# Patient Record
Sex: Female | Born: 1935 | Race: White | Hispanic: No | Marital: Married | Smoking: Never smoker
Health system: Southern US, Community
[De-identification: ages and names within clinical notes are randomized; demographics above are authoritative.]

## PROBLEM LIST (undated history)

## (undated) DIAGNOSIS — R002 Palpitations: Secondary | ICD-10-CM

## (undated) DIAGNOSIS — R21 Rash and other nonspecific skin eruption: Secondary | ICD-10-CM

## (undated) DIAGNOSIS — R194 Change in bowel habit: Secondary | ICD-10-CM

## (undated) DIAGNOSIS — D649 Anemia, unspecified: Secondary | ICD-10-CM

## (undated) DIAGNOSIS — I10 Essential (primary) hypertension: Secondary | ICD-10-CM

## (undated) DIAGNOSIS — M1990 Unspecified osteoarthritis, unspecified site: Secondary | ICD-10-CM

## (undated) DIAGNOSIS — K219 Gastro-esophageal reflux disease without esophagitis: Secondary | ICD-10-CM

## (undated) DIAGNOSIS — T4145XA Adverse effect of unspecified anesthetic, initial encounter: Secondary | ICD-10-CM

## (undated) HISTORY — PX: TONSILLECTOMY: SUR1361

## (undated) HISTORY — PX: APPENDECTOMY: SHX54

## (undated) HISTORY — PX: GYNECOLOGIC CRYOSURGERY: SHX857

---

## 1999-05-27 ENCOUNTER — Ambulatory Visit (HOSPITAL_COMMUNITY): Admission: RE | Admit: 1999-05-27 | Discharge: 1999-05-27 | Payer: Self-pay | Admitting: Gastroenterology

## 1999-12-28 ENCOUNTER — Other Ambulatory Visit: Admission: RE | Admit: 1999-12-28 | Discharge: 1999-12-28 | Payer: Self-pay | Admitting: *Deleted

## 2000-02-01 ENCOUNTER — Emergency Department (HOSPITAL_COMMUNITY): Admission: EM | Admit: 2000-02-01 | Discharge: 2000-02-02 | Payer: Self-pay | Admitting: Emergency Medicine

## 2000-12-21 ENCOUNTER — Other Ambulatory Visit: Admission: RE | Admit: 2000-12-21 | Discharge: 2000-12-21 | Payer: Self-pay | Admitting: *Deleted

## 2003-01-01 ENCOUNTER — Other Ambulatory Visit: Admission: RE | Admit: 2003-01-01 | Discharge: 2003-01-01 | Payer: Self-pay | Admitting: *Deleted

## 2004-01-05 ENCOUNTER — Other Ambulatory Visit: Admission: RE | Admit: 2004-01-05 | Discharge: 2004-01-05 | Payer: Self-pay | Admitting: *Deleted

## 2004-08-24 ENCOUNTER — Ambulatory Visit (HOSPITAL_COMMUNITY): Admission: RE | Admit: 2004-08-24 | Discharge: 2004-08-24 | Payer: Self-pay | Admitting: Gastroenterology

## 2006-02-23 ENCOUNTER — Other Ambulatory Visit: Admission: RE | Admit: 2006-02-23 | Discharge: 2006-02-23 | Payer: Self-pay | Admitting: *Deleted

## 2007-03-19 ENCOUNTER — Other Ambulatory Visit: Admission: RE | Admit: 2007-03-19 | Discharge: 2007-03-19 | Payer: Self-pay | Admitting: *Deleted

## 2007-05-15 ENCOUNTER — Encounter: Admission: RE | Admit: 2007-05-15 | Discharge: 2007-06-07 | Payer: Self-pay | Admitting: *Deleted

## 2007-06-28 ENCOUNTER — Emergency Department (HOSPITAL_COMMUNITY): Admission: EM | Admit: 2007-06-28 | Discharge: 2007-06-29 | Payer: Self-pay | Admitting: *Deleted

## 2009-08-06 ENCOUNTER — Ambulatory Visit (HOSPITAL_COMMUNITY): Admission: RE | Admit: 2009-08-06 | Discharge: 2009-08-06 | Payer: Self-pay | Admitting: Gastroenterology

## 2011-02-11 NOTE — Op Note (Signed)
NAMELEIGHLA, CHESTNUTT              ACCOUNT NO.:  [id number]   MEDICAL RECORD NO.:  [id number]          PATIENT TYPE:  AMB   LOCATION:  ENDO                         FACILITY:  [facility]   PHYSICIAN:  Danise Edge, M.D.   DATE OF BIRTH:  [birth year]   DATE OF PROCEDURE:  08/24/2004  DATE OF DISCHARGE:                                 OPERATIVE REPORT   INDICATIONS:  Ms. Kaniyah Lisby is a 77 year old female born 02/20/1932.  Approximately 10 years ago, Ms. Mace underwent a colonoscopy and a  neoplastic polyp was removed. Her colonoscopy performed by me 5 years ago  was normal. Ms. Christenbury is scheduled to undergo a surveillance colonoscopy  with polypectomy to prevent colon cancer.   ENDOSCOPIST:  Danise Edge, M.D.   PREMEDICATION:  Versed 6 mg, Demerol 50 mg.   PROCEDURE NOTE:  After obtaining informed consent, Ms. Lagasse was placed  in the left lateral decubitus position. I administered intravenous Demerol  and intravenous Versed to achieve conscious sedation for the procedure. The  patient's blood pressure, oxygen saturation, and cardiac rhythm were  monitored throughout the procedure and documented in the medical record.   Anal inspection and digital rectal exam were normal. The Olympus adjustable  pediatric colonoscopy was introduced into the rectum and advanced to the  cecum. Colonic preparation for exam today was excellent.   Rectum:  Normal.   Sigmoid colon and descending colon:  Normal.   Splenic flexure:  Normal.   Transverse colon:  Normal.   Hepatic flexure:  Normal.   Ascending colon:  Normal.   Cecum and ileocecal valve:  Normal.   ASSESSMENT:  Normal proctocolonoscopy to the cecum. No endoscopic evidence  for the presence of recurrent colorectal neoplasia.   RECOMMENDATIONS:  Repeat colon polyp screen in 5 years.      MJ/MEDQ  D:  08/24/2004  T:  08/24/2004  Job:  [id number]   cc:   Al Decant. Janey Greaser, MD  [address]  [place]  [place] [address]  Fax: [phone]

## 2011-05-26 ENCOUNTER — Ambulatory Visit (HOSPITAL_COMMUNITY)
Admission: RE | Admit: 2011-05-26 | Discharge: 2011-05-26 | Disposition: A | Discharge status: Home / Self Care | Payer: Medicare Other | Source: Ambulatory Visit | Attending: Gastroenterology | Admitting: Gastroenterology

## 2011-05-26 DIAGNOSIS — K5730 Diverticulosis of large intestine without perforation or abscess without bleeding: Secondary | ICD-10-CM | POA: Insufficient documentation

## 2011-05-26 DIAGNOSIS — R197 Diarrhea, unspecified: Secondary | ICD-10-CM | POA: Insufficient documentation

## 2011-05-26 DIAGNOSIS — K648 Other hemorrhoids: Secondary | ICD-10-CM | POA: Insufficient documentation

## 2011-06-05 NOTE — Op Note (Signed)
  NAMECHYNNA, [name] NO.:  [id number]  MEDICAL RECORD NO.:  [id number]  LOCATION:  WLEN                         FACILITY:  [facility]  PHYSICIAN:  Danise Edge, M.D.   DATE OF BIRTH:  07-May-1932  DATE OF PROCEDURE:  05/26/2011 DATE OF DISCHARGE:                              OPERATIVE REPORT   REFERRING PHYSICIAN:  Hessie Diener (Valla Leaver) Tenny Craw, M.D., the family practitioner.  HISTORY:  Ms. Lori [name] is a 77 year old female, born on [birth year].  The patient underwent a normal colonoscopy in November 2010, except for the presence of left colonic diverticulosis.  In July 2012, patient had explosive nonbloody diarrhea while vacationing at [place].  She has continued to have intermittent loose bowel movements without gastrointestinal bleeding, excessive flatus, excessive mucus with bowel movements, and rectal pressure.  ENDOSCOPIST:  Danise Edge, M.D.  PREMEDICATION: 1. Fentanyl 50 mcg. 2. Versed 5 mg.  PROCEDURE:  Anal inspection and digital rectal exam were normal.  The Pentax pediatric colonoscope was introduced into the rectum and with some degree of difficulty due to sigmoid colonic loop formation, advanced to approximately 35 cm from the anal verge.  Endoscopic appearance of the rectal mucosa was normal.  Retroflexed view of the distal rectum showed moderate size nonbleeding internal hemorrhoids.  Examination of the sigmoid colon revealed extensive colonic diverticulosis without diverticulitis.  ASSESSMENT: 1. Sigmoid colonic diverticulosis. 2. Moderate sized internal hemorrhoids. 3. Otherwise, normal flexible proctosigmoidoscopy.          ______________________________ Danise Edge, M.D.     MJ/MEDQ  D:  05/26/2011  T:  05/27/2011  Job:  [id number]  cc:   Magnus Sinning) Tenny Craw, M.D. Fax: [phone]  Electronically Signed by Danise Edge M.D. on 06/05/2011 09:13:23 AM

## 2011-07-07 LAB — CBC
HCT: 38.8
Hemoglobin: 13
MCHC: 33.5
MCV: 94
Platelets: 302
RBC: 4.12
RDW: 13
WBC: 9.3

## 2011-07-07 LAB — COMPREHENSIVE METABOLIC PANEL
CO2: 28
Calcium: 9.4
Creatinine, Ser: 0.64
GFR calc Af Amer: >60
GFR calc non Af Amer: >60
Glucose, Bld: 96
Total Protein: 6.8

## 2011-07-07 LAB — URINE MICROSCOPIC-ADD ON

## 2011-07-07 LAB — URINALYSIS, ROUTINE W REFLEX MICROSCOPIC
Bilirubin Urine: NEGATIVE
Glucose, UA: NEGATIVE
Hgb urine dipstick: NEGATIVE
Ketones, ur: NEGATIVE
Nitrite: NEGATIVE
Protein, ur: NEGATIVE
Specific Gravity, Urine: 1.011
Urobilinogen, UA: 0.2
pH: 6.5

## 2011-07-07 LAB — DIFFERENTIAL
Basophils Absolute: 0
Basophils Relative: 0
Eosinophils Absolute: 0.1
Eosinophils Relative: 1
Lymphocytes Relative: 33
Lymphs Abs: 3.1
Monocytes Absolute: 0.8 — ABNORMAL HIGH
Monocytes Relative: 9
Neutro Abs: 5.3
Neutrophils Relative %: 57

## 2011-07-07 LAB — LIPASE, BLOOD: Lipase: 22

## 2011-12-27 DIAGNOSIS — R198 Other specified symptoms and signs involving the digestive system and abdomen: Secondary | ICD-10-CM | POA: Diagnosis not present

## 2012-01-02 DIAGNOSIS — Z1231 Encounter for screening mammogram for malignant neoplasm of breast: Secondary | ICD-10-CM | POA: Diagnosis not present

## 2012-01-04 DIAGNOSIS — R928 Other abnormal and inconclusive findings on diagnostic imaging of breast: Secondary | ICD-10-CM | POA: Diagnosis not present

## 2012-01-17 DIAGNOSIS — R198 Other specified symptoms and signs involving the digestive system and abdomen: Secondary | ICD-10-CM | POA: Diagnosis not present

## 2012-01-17 DIAGNOSIS — R141 Gas pain: Secondary | ICD-10-CM | POA: Diagnosis not present

## 2012-01-17 DIAGNOSIS — R634 Abnormal weight loss: Secondary | ICD-10-CM | POA: Diagnosis not present

## 2012-01-17 DIAGNOSIS — R143 Flatulence: Secondary | ICD-10-CM | POA: Diagnosis not present

## 2012-01-17 DIAGNOSIS — R197 Diarrhea, unspecified: Secondary | ICD-10-CM | POA: Diagnosis not present

## 2012-01-18 DIAGNOSIS — R197 Diarrhea, unspecified: Secondary | ICD-10-CM | POA: Diagnosis not present

## 2012-02-29 DIAGNOSIS — R197 Diarrhea, unspecified: Secondary | ICD-10-CM | POA: Diagnosis not present

## 2012-02-29 DIAGNOSIS — R143 Flatulence: Secondary | ICD-10-CM | POA: Diagnosis not present

## 2012-02-29 DIAGNOSIS — R142 Eructation: Secondary | ICD-10-CM | POA: Diagnosis not present

## 2012-04-03 DIAGNOSIS — D649 Anemia, unspecified: Secondary | ICD-10-CM | POA: Diagnosis not present

## 2012-04-03 DIAGNOSIS — E7800 Pure hypercholesterolemia, unspecified: Secondary | ICD-10-CM | POA: Diagnosis not present

## 2012-04-03 DIAGNOSIS — I10 Essential (primary) hypertension: Secondary | ICD-10-CM | POA: Diagnosis not present

## 2012-05-16 DIAGNOSIS — R141 Gas pain: Secondary | ICD-10-CM | POA: Diagnosis not present

## 2012-05-16 DIAGNOSIS — R197 Diarrhea, unspecified: Secondary | ICD-10-CM | POA: Diagnosis not present

## 2012-05-16 DIAGNOSIS — R142 Eructation: Secondary | ICD-10-CM | POA: Diagnosis not present

## 2012-07-30 DIAGNOSIS — Z23 Encounter for immunization: Secondary | ICD-10-CM | POA: Diagnosis not present

## 2012-08-08 DIAGNOSIS — R198 Other specified symptoms and signs involving the digestive system and abdomen: Secondary | ICD-10-CM | POA: Diagnosis not present

## 2012-08-08 DIAGNOSIS — R141 Gas pain: Secondary | ICD-10-CM | POA: Diagnosis not present

## 2012-08-08 DIAGNOSIS — R143 Flatulence: Secondary | ICD-10-CM | POA: Diagnosis not present

## 2012-09-14 DIAGNOSIS — M169 Osteoarthritis of hip, unspecified: Secondary | ICD-10-CM | POA: Diagnosis not present

## 2012-09-26 HISTORY — PX: JOINT REPLACEMENT: SHX530

## 2012-10-26 DIAGNOSIS — M169 Osteoarthritis of hip, unspecified: Secondary | ICD-10-CM | POA: Diagnosis not present

## 2012-10-31 DIAGNOSIS — M169 Osteoarthritis of hip, unspecified: Secondary | ICD-10-CM | POA: Diagnosis not present

## 2012-11-02 DIAGNOSIS — M169 Osteoarthritis of hip, unspecified: Secondary | ICD-10-CM | POA: Diagnosis not present

## 2012-11-05 DIAGNOSIS — M169 Osteoarthritis of hip, unspecified: Secondary | ICD-10-CM | POA: Diagnosis not present

## 2012-11-07 DIAGNOSIS — M169 Osteoarthritis of hip, unspecified: Secondary | ICD-10-CM | POA: Diagnosis not present

## 2012-11-12 DIAGNOSIS — M169 Osteoarthritis of hip, unspecified: Secondary | ICD-10-CM | POA: Diagnosis not present

## 2012-11-16 DIAGNOSIS — M169 Osteoarthritis of hip, unspecified: Secondary | ICD-10-CM | POA: Diagnosis not present

## 2012-11-19 DIAGNOSIS — M169 Osteoarthritis of hip, unspecified: Secondary | ICD-10-CM | POA: Diagnosis not present

## 2012-11-21 DIAGNOSIS — M169 Osteoarthritis of hip, unspecified: Secondary | ICD-10-CM | POA: Diagnosis not present

## 2013-02-11 DIAGNOSIS — R143 Flatulence: Secondary | ICD-10-CM | POA: Diagnosis not present

## 2013-02-26 DIAGNOSIS — M169 Osteoarthritis of hip, unspecified: Secondary | ICD-10-CM | POA: Diagnosis not present

## 2013-04-03 DIAGNOSIS — D649 Anemia, unspecified: Secondary | ICD-10-CM | POA: Diagnosis not present

## 2013-04-03 DIAGNOSIS — R002 Palpitations: Secondary | ICD-10-CM | POA: Diagnosis not present

## 2013-04-03 DIAGNOSIS — E7800 Pure hypercholesterolemia, unspecified: Secondary | ICD-10-CM | POA: Diagnosis not present

## 2013-04-03 DIAGNOSIS — I10 Essential (primary) hypertension: Secondary | ICD-10-CM | POA: Diagnosis not present

## 2013-06-18 DIAGNOSIS — R21 Rash and other nonspecific skin eruption: Secondary | ICD-10-CM | POA: Diagnosis not present

## 2013-06-18 DIAGNOSIS — M169 Osteoarthritis of hip, unspecified: Secondary | ICD-10-CM | POA: Diagnosis not present

## 2013-06-18 DIAGNOSIS — Z23 Encounter for immunization: Secondary | ICD-10-CM | POA: Diagnosis not present

## 2013-06-18 DIAGNOSIS — Z1231 Encounter for screening mammogram for malignant neoplasm of breast: Secondary | ICD-10-CM | POA: Diagnosis not present

## 2013-06-28 NOTE — Progress Notes (Signed)
Need orders in EPIC.  Surgery scheduled for 07/17/13.  Preop on 07/10/13 at 0930am  Thank YOu.

## 2013-07-04 ENCOUNTER — Other Ambulatory Visit: Payer: Self-pay | Admitting: Orthopedic Surgery

## 2013-07-04 NOTE — Progress Notes (Signed)
Need orders please - pt coming for preop WED 07/10/13 - thank you

## 2013-07-08 ENCOUNTER — Encounter (HOSPITAL_COMMUNITY): Payer: Self-pay | Admitting: Pharmacy Technician

## 2013-07-10 ENCOUNTER — Encounter (HOSPITAL_COMMUNITY): Payer: Self-pay

## 2013-07-10 ENCOUNTER — Ambulatory Visit (HOSPITAL_COMMUNITY)
Admission: RE | Admit: 2013-07-10 | Discharge: 2013-07-10 | Disposition: A | Discharge status: Home / Self Care | Payer: Medicare Other | Source: Ambulatory Visit | Attending: Orthopedic Surgery | Admitting: Orthopedic Surgery

## 2013-07-10 ENCOUNTER — Encounter (HOSPITAL_COMMUNITY)
Admission: RE | Admit: 2013-07-10 | Discharge: 2013-07-10 | Disposition: A | Discharge status: Home / Self Care | Payer: Medicare Other | Source: Ambulatory Visit | Attending: Orthopedic Surgery | Admitting: Orthopedic Surgery

## 2013-07-10 DIAGNOSIS — Z01812 Encounter for preprocedural laboratory examination: Secondary | ICD-10-CM | POA: Diagnosis not present

## 2013-07-10 DIAGNOSIS — I10 Essential (primary) hypertension: Secondary | ICD-10-CM | POA: Insufficient documentation

## 2013-07-10 DIAGNOSIS — M169 Osteoarthritis of hip, unspecified: Secondary | ICD-10-CM | POA: Insufficient documentation

## 2013-07-10 DIAGNOSIS — R52 Pain, unspecified: Secondary | ICD-10-CM

## 2013-07-10 DIAGNOSIS — Z01818 Encounter for other preprocedural examination: Secondary | ICD-10-CM | POA: Diagnosis not present

## 2013-07-10 DIAGNOSIS — M1610 Unilateral primary osteoarthritis, unspecified hip: Secondary | ICD-10-CM | POA: Insufficient documentation

## 2013-07-10 HISTORY — DX: Rash and other nonspecific skin eruption: R21

## 2013-07-10 HISTORY — DX: Unspecified osteoarthritis, unspecified site: M19.90

## 2013-07-10 HISTORY — DX: Essential (primary) hypertension: I10

## 2013-07-10 HISTORY — DX: Change in bowel habit: R19.4

## 2013-07-10 HISTORY — DX: Palpitations: R00.2

## 2013-07-10 HISTORY — DX: Anemia, unspecified: D64.9

## 2013-07-10 HISTORY — DX: Gastro-esophageal reflux disease without esophagitis: K21.9

## 2013-07-10 HISTORY — DX: Adverse effect of unspecified anesthetic, initial encounter: T41.45XA

## 2013-07-10 LAB — COMPREHENSIVE METABOLIC PANEL
ALT: 13 U/L (ref 0–35)
Albumin: 4.1 g/dL (ref 3.5–5.2)
Alkaline Phosphatase: 50 U/L (ref 39–117)
BUN: 13 mg/dL (ref 6–23)
CO2: 29 mEq/L (ref 19–32)
Chloride: 103 mEq/L (ref 96–112)
Creatinine, Ser: 0.63 mg/dL (ref 0.50–1.10)
GFR calc non Af Amer: 83 mL/min — ABNORMAL LOW (ref >90)
Potassium: 5.8 mEq/L — ABNORMAL HIGH (ref 3.5–5.1)
Total Bilirubin: 0.3 mg/dL (ref 0.3–1.2)

## 2013-07-10 LAB — URINALYSIS, ROUTINE W REFLEX MICROSCOPIC
Bilirubin Urine: NEGATIVE
Glucose, UA: NEGATIVE mg/dL
Hgb urine dipstick: NEGATIVE
Ketones, ur: NEGATIVE mg/dL
Specific Gravity, Urine: 1.013 (ref 1.005–1.030)
Urobilinogen, UA: 0.2 mg/dL (ref 0.0–1.0)
pH: 7 (ref 5.0–8.0)

## 2013-07-10 LAB — SURGICAL PCR SCREEN
MRSA, PCR: NEGATIVE
Staphylococcus aureus: NEGATIVE

## 2013-07-10 LAB — CBC
Hemoglobin: 13.1 g/dL (ref 12.0–15.0)
MCHC: 33.3 g/dL (ref 30.0–36.0)
RBC: 4.22 MIL/uL (ref 3.87–5.11)
WBC: 5.3 10*3/uL (ref 4.0–10.5)

## 2013-07-10 LAB — URINE MICROSCOPIC-ADD ON

## 2013-07-10 LAB — APTT: aPTT: 31 seconds (ref 24–37)

## 2013-07-10 LAB — ABO/RH: ABO/RH(D): A POS

## 2013-07-10 LAB — PROTIME-INR
INR: 0.94 (ref 0.00–1.49)
Prothrombin Time: 12.4 seconds (ref 11.6–15.2)

## 2013-07-10 NOTE — Patient Instructions (Signed)
TIERNAN MILLIKIN  07/10/2013                           YOUR PROCEDURE IS SCHEDULED ON:  07/17/13               PLEASE REPORT TO SHORT STAY CENTER AT :  9:15 AM               CALL THIS NUMBER IF ANY PROBLEMS THE DAY OF SURGERY :               [phone]                      REMEMBER:   Do not eat food or drink liquids AFTER MIDNIGHT   Take these medicines the morning of surgery with A SIP OF WATER:  METOPROLOL   Do not wear jewelry, make-up   Do not wear lotions, powders, or perfumes.   Do not shave legs or underarms 12 hrs. before surgery (men may shave face)  Do not bring valuables to the hospital.  Contacts, dentures or bridgework may not be worn into surgery.  Leave suitcase in the car. After surgery it may be brought to your room.  For patients admitted to the hospital more than one night, checkout time is 11:00                          The day of discharge.   Patients discharged the day of surgery will not be allowed to drive home                             If going home same day of surgery, must have someone stay with you first                           24 hrs at home and arrange for some one to drive you home from hospital.    Special Instructions:   Please read over the following fact sheets that you were given:               1. MRSA  INFORMATION                      2. [facility] PREPARING FOR SURGERY SHEET               3. INCENTIVE SPIROMETER                                                X_____________________________________________________________________        Failure to follow these instructions may result in cancellation of your surgery

## 2013-07-10 NOTE — Progress Notes (Signed)
Abnormal CMET routed to Dr. Aluisio 

## 2013-07-14 ENCOUNTER — Other Ambulatory Visit: Payer: Self-pay | Admitting: Orthopedic Surgery

## 2013-07-14 NOTE — H&P (Signed)
Lori [name]. Lori [name]  DOB: Sep 21, 1932 Married / Language: English / Race: White Female  Date of Admission:  07/17/2013  Chief Complaint:  Right Hip Pain  History of Present Illness The patient is a 79 year old female who comes in for a preoperative History and Physical. The patient is scheduled for a right total hip arthroplasty (anterior approach) to be performed by Dr. Gus Rankin. Aluisio, MD at [facility] on 07/17/2013. The patient is a 79 year old female who presents for follow up of their hip. The patient is being followed for their right hip pain, osteoarthritis and hamstring pain.  She wanted to discuss the anterior approach total hip and find out what her options are. She decided to not do the intra articular injection with Dr. Ethelene Hal. Unfortunately her hip is getting a lot worse. She has pain with most activities. Generally she does fairly well at rest and with only occasional rest pain. She generally sleeps fairly well at night. The pain is mainly in the groin going down the anterior thigh. She is not having back pain. She is not having lower extremity weakness or paresthesia. She is ready to get the hip fixed. They have been treated conservatively in the past for the above stated problem and despite conservative measures, they continue to have progressive pain and severe functional limitations and dysfunction. They have failed non-operative management including home exercise, medications. It is felt that they would benefit from undergoing total joint replacement. Risks and benefits of the procedure have been discussed with the patient and they elect to proceed with surgery. There are no active contraindications to surgery such as ongoing infection or rapidly progressive neurological disease.   Problem List Osteoarthritis, Hip (715.35)   Allergies Codeine Phosphate *ANALGESICS - OPIOID*    Family History Diabetes Mellitus. father and sister Heart Disease.  mother, father, sister, grandfather mothers side and grandfather fathers side Rheumatoid Arthritis. grandfather fathers side Heart disease in female family member before age 57 Heart disease in female family member before age 28 Osteoarthritis. grandmother mothers side and grandfather fathers side    Social History Exercise. Exercises daily; does running / walking and other Illicit drug use. no Living situation. live with spouse Drug/Alcohol Rehab (Previously). no Children. 4 Current work status. retired Financial planner (Currently). no Tobacco use. never smoker Tobacco / smoke exposure. no Marital status. married Number of flights of stairs before winded. 4-5 Pain Contract. no Alcohol use. current drinker; drinks wine; 5-7 per week Post-Surgical Plans. home Advance Directives. Lving Will, Healthcare POA    Medication History Tylenol Extra Strength (500MG  Tablet, Oral) Active. Metoprolol Tartrate (25MG  Tablet, Oral) Active. Simvastatin (40MG  Tablet, Oral) Active. Benazepril HCl (10MG  Tablet, Oral) Active.   Past Surgical History Tubal Ligation Tonsillectomy Appendectomy Colon Polyp Removal - Colonoscopy  Medical History High blood pressure Hypercholesterolemia Irritable bowel syndrome Impaired Vision Impaired Hearing Varicose veins Hemorrhoids Measles Mumps    Review of Systems General:Not Present- Chills, Fever, Night Sweats, Fatigue, Weight Gain, Weight Loss and Memory Loss. Skin:Not Present- Hives, Itching, Rash, Eczema and Lesions. HEENT:Not Present- Tinnitus, Headache, Double Vision, Visual Loss, Hearing Loss and Dentures. Respiratory:Not Present- Shortness of breath with exertion, Shortness of breath at rest, Allergies, Coughing up blood and Chronic Cough. Cardiovascular:Not Present- Chest Pain, Racing/skipping heartbeats, Difficulty Breathing Lying Down, Murmur, Swelling and Palpitations. Gastrointestinal:Not Present-  Bloody Stool, Heartburn, Abdominal Pain, Vomiting, Nausea, Constipation, Diarrhea, Difficulty Swallowing, Jaundice and Loss of appetitie. Female Genitourinary:Present- Urinary frequency. Not Present- Blood in Urine, Weak  urinary stream, Discharge, Flank Pain, Incontinence, Painful Urination, Urgency, Urinary Retention and Urinating at Night. Musculoskeletal:Present- Muscle Pain, Joint Pain and Morning Stiffness. Not Present- Muscle Weakness, Joint Swelling, Back Pain and Spasms. Neurological:Not Present- Tremor, Dizziness, Blackout spells, Paralysis, Difficulty with balance and Weakness. Psychiatric:Not Present- Insomnia.    Vitals Pulse: 84 (Regular) Resp.: 14 (Unlabored) BP: 118/58 (Sitting, Right Arm, Standard)     Physical Exam The physical exam findings are as follows:   General Mental Status - Alert, cooperative and good historian. General Appearance- pleasant. Not in acute distress. Orientation- Oriented X3. Build & Nutrition- Well nourished and Well developed.   Head and Neck Head- normocephalic, atraumatic . Neck Global Assessment- supple. no bruit auscultated on the right and no bruit auscultated on the left.   Eye Vision- Wears corrective lenses. Pupil- Bilateral- Round. Motion- Bilateral- EOMI.   ENMT  bilateral hearing aids  Chest and Lung Exam Auscultation: Breath sounds:- clear at anterior chest wall and - clear at posterior chest wall. Adventitious sounds:- No Adventitious sounds.   Cardiovascular Auscultation:Rhythm- Regular rate and rhythm. Heart Sounds- S1 WNL and S2 WNL. Murmurs & Other Heart Sounds:Auscultation of the heart reveals - No Murmurs.   Abdomen Palpation/Percussion:Tenderness- Abdomen is non-tender to palpation. Rigidity (guarding)- Abdomen is soft. Auscultation:Auscultation of the abdomen reveals - Bowel sounds normal.   Female Genitourinary Not done, not pertinent to present  illness  Musculoskeletal On exam she is alert and oriented in no apparent distress. Her right hip can be flexed to about 90 with no internal rotation and about 20 external rotation, 20 abduction. Left hip range of motion is normal.  RADIOGRAPHS: Her most recent radiographs, AP pelvis and lateral of the right hip from December, and at that point she had significant bone on bone arthritis with large osteophyte formation.   Assessment & Plan Osteoarthritis, Hip (715.35) Impression: Right Hip  Note: Plan is for a Right Total Hip Replacement - Anterior Approach by Dr. Lequita Halt.  Plan is to go home with family.  PCP - Dr. Vianne Bulls - Patient has been seen preoperatively and felt to be stable for surgery.  The patient does not have any contraindications and will receive TXA (tranexamic acid) prior to surgery.  Avel Peace, PA-C

## 2013-07-16 DIAGNOSIS — H903 Sensorineural hearing loss, bilateral: Secondary | ICD-10-CM | POA: Diagnosis not present

## 2013-07-17 ENCOUNTER — Inpatient Hospital Stay (HOSPITAL_COMMUNITY): Payer: Medicare Other

## 2013-07-17 ENCOUNTER — Encounter (HOSPITAL_COMMUNITY): Payer: Medicare Other | Admitting: Anesthesiology

## 2013-07-17 ENCOUNTER — Inpatient Hospital Stay (HOSPITAL_COMMUNITY)
Admission: RE | Admit: 2013-07-17 | Discharge: 2013-07-19 | DRG: 470 | Disposition: A | Discharge status: Home Health | Payer: Medicare Other | Source: Ambulatory Visit | Attending: Orthopedic Surgery | Admitting: Orthopedic Surgery

## 2013-07-17 ENCOUNTER — Encounter (HOSPITAL_COMMUNITY): Payer: Self-pay | Admitting: *Deleted

## 2013-07-17 ENCOUNTER — Encounter (HOSPITAL_COMMUNITY)
Admission: RE | Disposition: A | Discharge status: Home Health | Payer: Self-pay | Source: Ambulatory Visit | Attending: Orthopedic Surgery

## 2013-07-17 ENCOUNTER — Inpatient Hospital Stay (HOSPITAL_COMMUNITY): Payer: Medicare Other | Admitting: Anesthesiology

## 2013-07-17 DIAGNOSIS — Z79899 Other long term (current) drug therapy: Secondary | ICD-10-CM

## 2013-07-17 DIAGNOSIS — I10 Essential (primary) hypertension: Secondary | ICD-10-CM | POA: Diagnosis present

## 2013-07-17 DIAGNOSIS — E7800 Pure hypercholesterolemia, unspecified: Secondary | ICD-10-CM | POA: Diagnosis present

## 2013-07-17 DIAGNOSIS — H547 Unspecified visual loss: Secondary | ICD-10-CM | POA: Diagnosis present

## 2013-07-17 DIAGNOSIS — M1610 Unilateral primary osteoarthritis, unspecified hip: Secondary | ICD-10-CM | POA: Diagnosis not present

## 2013-07-17 DIAGNOSIS — H9190 Unspecified hearing loss, unspecified ear: Secondary | ICD-10-CM | POA: Diagnosis present

## 2013-07-17 DIAGNOSIS — K219 Gastro-esophageal reflux disease without esophagitis: Secondary | ICD-10-CM | POA: Diagnosis present

## 2013-07-17 DIAGNOSIS — E871 Hypo-osmolality and hyponatremia: Secondary | ICD-10-CM | POA: Diagnosis not present

## 2013-07-17 DIAGNOSIS — D62 Acute posthemorrhagic anemia: Secondary | ICD-10-CM | POA: Diagnosis not present

## 2013-07-17 DIAGNOSIS — M169 Osteoarthritis of hip, unspecified: Secondary | ICD-10-CM | POA: Diagnosis present

## 2013-07-17 DIAGNOSIS — Z96649 Presence of unspecified artificial hip joint: Secondary | ICD-10-CM | POA: Diagnosis not present

## 2013-07-17 DIAGNOSIS — Z471 Aftercare following joint replacement surgery: Secondary | ICD-10-CM | POA: Diagnosis not present

## 2013-07-17 HISTORY — PX: TOTAL HIP ARTHROPLASTY: SHX124

## 2013-07-17 LAB — TYPE AND SCREEN: ABO/RH(D): A POS

## 2013-07-17 SURGERY — ARTHROPLASTY, HIP, TOTAL, ANTERIOR APPROACH
Anesthesia: General | Site: Hip | Laterality: Right | Wound class: Clean

## 2013-07-17 MED ORDER — CEFAZOLIN SODIUM-DEXTROSE 2-3 GM-% IV SOLR
2.0000 g | INTRAVENOUS | Status: AC
  Administered 2013-07-17: 2 g via INTRAVENOUS

## 2013-07-17 MED ORDER — CEFAZOLIN SODIUM 1-5 GM-% IV SOLN
1.0000 g | Freq: Four times a day (QID) | INTRAVENOUS | Status: AC
  Administered 2013-07-17 – 2013-07-18 (×2): 1 g via INTRAVENOUS
  Filled 2013-07-17 (×2): qty 50

## 2013-07-17 MED ORDER — DOCUSATE SODIUM 100 MG PO CAPS
100.0000 mg | ORAL_CAPSULE | Freq: Two times a day (BID) | ORAL | Status: DC
  Administered 2013-07-17 – 2013-07-18 (×3): 100 mg via ORAL

## 2013-07-17 MED ORDER — LACTATED RINGERS IV SOLN
INTRAVENOUS | Status: DC | PRN
  Administered 2013-07-17 (×3): via INTRAVENOUS

## 2013-07-17 MED ORDER — BISACODYL 10 MG RE SUPP: 10.0000 mg | Freq: Every day | RECTAL | Status: DC | PRN

## 2013-07-17 MED ORDER — GLYCOPYRROLATE 0.2 MG/ML IJ SOLN
INTRAMUSCULAR | Status: DC | PRN
  Administered 2013-07-17: 0.6 mg via INTRAVENOUS

## 2013-07-17 MED ORDER — MIDAZOLAM HCL 5 MG/5ML IJ SOLN
INTRAMUSCULAR | Status: DC | PRN
  Administered 2013-07-17: .5 mg via INTRAVENOUS

## 2013-07-17 MED ORDER — SODIUM CHLORIDE 0.9 % IJ SOLN
INTRAMUSCULAR | Status: DC | PRN
  Administered 2013-07-17: 14:00:00

## 2013-07-17 MED ORDER — ACETAMINOPHEN 325 MG PO TABS
650.0000 mg | ORAL_TABLET | Freq: Four times a day (QID) | ORAL | Status: DC | PRN
  Administered 2013-07-18 – 2013-07-19 (×2): 650 mg via ORAL
  Filled 2013-07-17 (×2): qty 2

## 2013-07-17 MED ORDER — OXYCODONE HCL 5 MG PO TABS
5.0000 mg | ORAL_TABLET | ORAL | Status: DC | PRN
  Administered 2013-07-17 (×2): 5 mg via ORAL
  Filled 2013-07-17 (×2): qty 1

## 2013-07-17 MED ORDER — 0.9 % SODIUM CHLORIDE (POUR BTL) OPTIME
TOPICAL | Status: DC | PRN
  Administered 2013-07-17: 1000 mL

## 2013-07-17 MED ORDER — MORPHINE SULFATE 2 MG/ML IJ SOLN: 1.0000 mg | INTRAMUSCULAR | Status: DC | PRN

## 2013-07-17 MED ORDER — DEXAMETHASONE SODIUM PHOSPHATE 10 MG/ML IJ SOLN
10.0000 mg | Freq: Every day | INTRAMUSCULAR | Status: AC
  Filled 2013-07-17: qty 1

## 2013-07-17 MED ORDER — PHENOL 1.4 % MT LIQD: 1.0000 | OROMUCOSAL | Status: DC | PRN

## 2013-07-17 MED ORDER — FLUCONAZOLE 100 MG PO TABS
100.0000 mg | ORAL_TABLET | Freq: Every day | ORAL | Status: DC
  Administered 2013-07-17 – 2013-07-18 (×2): 100 mg via ORAL
  Filled 2013-07-17 (×3): qty 1

## 2013-07-17 MED ORDER — PHENYLEPHRINE HCL 10 MG/ML IJ SOLN
10.0000 mg | INTRAVENOUS | Status: DC | PRN
  Administered 2013-07-17: 20 ug/min via INTRAVENOUS

## 2013-07-17 MED ORDER — CEFAZOLIN SODIUM-DEXTROSE 2-3 GM-% IV SOLR
INTRAVENOUS | Status: AC
  Filled 2013-07-17: qty 50

## 2013-07-17 MED ORDER — TRAMADOL HCL 50 MG PO TABS
50.0000 mg | ORAL_TABLET | Freq: Four times a day (QID) | ORAL | Status: DC | PRN
  Administered 2013-07-18 (×2): 100 mg via ORAL
  Filled 2013-07-17 (×2): qty 2

## 2013-07-17 MED ORDER — LIP MEDEX EX OINT
TOPICAL_OINTMENT | CUTANEOUS | Status: DC | PRN
  Filled 2013-07-17: qty 7

## 2013-07-17 MED ORDER — DEXAMETHASONE 4 MG PO TABS
10.0000 mg | ORAL_TABLET | Freq: Every day | ORAL | Status: AC
  Administered 2013-07-18: 10:00:00 10 mg via ORAL
  Filled 2013-07-17: qty 1

## 2013-07-17 MED ORDER — KETOROLAC TROMETHAMINE 15 MG/ML IJ SOLN: 7.5000 mg | Freq: Four times a day (QID) | INTRAMUSCULAR | Status: AC | PRN

## 2013-07-17 MED ORDER — STERILE WATER FOR IRRIGATION IR SOLN
Status: DC | PRN
  Administered 2013-07-17: 3000 mL

## 2013-07-17 MED ORDER — DEXTROSE-NACL 5-0.9 % IV SOLN
INTRAVENOUS | Status: DC
Start: 2013-07-17 — End: 2013-07-19
  Administered 2013-07-17 – 2013-07-18 (×2): via INTRAVENOUS

## 2013-07-17 MED ORDER — METOPROLOL TARTRATE 12.5 MG HALF TABLET
12.5000 mg | ORAL_TABLET | Freq: Two times a day (BID) | ORAL | Status: DC
  Administered 2013-07-18 (×2): 12.5 mg via ORAL
  Filled 2013-07-17 (×4): qty 1

## 2013-07-17 MED ORDER — ACETAMINOPHEN 650 MG RE SUPP: 650.0000 mg | Freq: Four times a day (QID) | RECTAL | Status: DC | PRN

## 2013-07-17 MED ORDER — NEOSTIGMINE METHYLSULFATE 1 MG/ML IJ SOLN
INTRAMUSCULAR | Status: DC | PRN
  Administered 2013-07-17: 4 mg via INTRAVENOUS

## 2013-07-17 MED ORDER — ONDANSETRON HCL 4 MG PO TABS: 4.0000 mg | ORAL_TABLET | Freq: Four times a day (QID) | ORAL | Status: DC | PRN

## 2013-07-17 MED ORDER — METHOCARBAMOL 500 MG PO TABS
500.0000 mg | ORAL_TABLET | Freq: Four times a day (QID) | ORAL | Status: DC | PRN
  Administered 2013-07-17: 500 mg via ORAL
  Filled 2013-07-17: qty 1

## 2013-07-17 MED ORDER — ACETAMINOPHEN 500 MG PO TABS
1000.0000 mg | ORAL_TABLET | Freq: Four times a day (QID) | ORAL | Status: AC
  Administered 2013-07-17 – 2013-07-18 (×4): 1000 mg via ORAL
  Filled 2013-07-17 (×5): qty 2

## 2013-07-17 MED ORDER — METOCLOPRAMIDE HCL 5 MG/ML IJ SOLN: 5.0000 mg | Freq: Three times a day (TID) | INTRAMUSCULAR | Status: DC | PRN

## 2013-07-17 MED ORDER — PROPOFOL 10 MG/ML IV BOLUS
INTRAVENOUS | Status: DC | PRN
  Administered 2013-07-17: 130 mg via INTRAVENOUS

## 2013-07-17 MED ORDER — SIMVASTATIN 20 MG PO TABS
20.0000 mg | ORAL_TABLET | Freq: Every evening | ORAL | Status: DC
  Administered 2013-07-17 – 2013-07-18 (×2): 20 mg via ORAL
  Filled 2013-07-17 (×3): qty 1

## 2013-07-17 MED ORDER — HYDROMORPHONE HCL PF 1 MG/ML IJ SOLN: 0.2500 mg | INTRAMUSCULAR | Status: DC | PRN

## 2013-07-17 MED ORDER — DEXAMETHASONE SODIUM PHOSPHATE 10 MG/ML IJ SOLN: 10.0000 mg | Freq: Once | INTRAMUSCULAR | Status: DC

## 2013-07-17 MED ORDER — METHOCARBAMOL 100 MG/ML IJ SOLN
500.0000 mg | Freq: Four times a day (QID) | INTRAVENOUS | Status: DC | PRN
  Administered 2013-07-17: 500 mg via INTRAVENOUS
  Filled 2013-07-17 (×2): qty 5

## 2013-07-17 MED ORDER — DEXAMETHASONE SODIUM PHOSPHATE 10 MG/ML IJ SOLN
INTRAMUSCULAR | Status: DC | PRN
  Administered 2013-07-17: 10 mg via INTRAVENOUS

## 2013-07-17 MED ORDER — FENTANYL CITRATE 0.05 MG/ML IJ SOLN
INTRAMUSCULAR | Status: DC | PRN
  Administered 2013-07-17: 25 ug via INTRAVENOUS
  Administered 2013-07-17: 100 ug via INTRAVENOUS

## 2013-07-17 MED ORDER — ONDANSETRON HCL 4 MG/2ML IJ SOLN: 4.0000 mg | Freq: Four times a day (QID) | INTRAMUSCULAR | Status: DC | PRN

## 2013-07-17 MED ORDER — RIVAROXABAN 10 MG PO TABS
10.0000 mg | ORAL_TABLET | Freq: Every day | ORAL | Status: DC
  Administered 2013-07-18 – 2013-07-19 (×2): 10 mg via ORAL
  Filled 2013-07-17 (×3): qty 1

## 2013-07-17 MED ORDER — ROCURONIUM BROMIDE 100 MG/10ML IV SOLN
INTRAVENOUS | Status: DC | PRN
  Administered 2013-07-17: 40 mg via INTRAVENOUS

## 2013-07-17 MED ORDER — PROMETHAZINE HCL 25 MG/ML IJ SOLN: 6.2500 mg | INTRAMUSCULAR | Status: DC | PRN

## 2013-07-17 MED ORDER — PHENYLEPHRINE HCL 10 MG/ML IJ SOLN
INTRAMUSCULAR | Status: DC | PRN
  Administered 2013-07-17 (×4): 80 ug via INTRAVENOUS

## 2013-07-17 MED ORDER — FLEET ENEMA 7-19 GM/118ML RE ENEM: 1.0000 | ENEMA | Freq: Once | RECTAL | Status: AC | PRN

## 2013-07-17 MED ORDER — SODIUM CHLORIDE 0.9 % IJ SOLN
INTRAMUSCULAR | Status: AC
  Filled 2013-07-17: qty 50

## 2013-07-17 MED ORDER — SODIUM CHLORIDE 0.9 % IV SOLN: INTRAVENOUS | Status: DC

## 2013-07-17 MED ORDER — LIDOCAINE HCL (CARDIAC) 20 MG/ML IV SOLN
INTRAVENOUS | Status: DC | PRN
  Administered 2013-07-17: 75 mg via INTRAVENOUS

## 2013-07-17 MED ORDER — ACETAMINOPHEN 500 MG PO TABS
1000.0000 mg | ORAL_TABLET | Freq: Once | ORAL | Status: AC
  Administered 2013-07-17: 1000 mg via ORAL
  Filled 2013-07-17: qty 2

## 2013-07-17 MED ORDER — BUPIVACAINE HCL (PF) 0.25 % IJ SOLN
INTRAMUSCULAR | Status: AC
  Filled 2013-07-17: qty 30

## 2013-07-17 MED ORDER — METOCLOPRAMIDE HCL 10 MG PO TABS: 5.0000 mg | ORAL_TABLET | Freq: Three times a day (TID) | ORAL | Status: DC | PRN

## 2013-07-17 MED ORDER — POLYETHYLENE GLYCOL 3350 17 G PO PACK: 17.0000 g | PACK | Freq: Every day | ORAL | Status: DC | PRN

## 2013-07-17 MED ORDER — FENTANYL CITRATE 0.05 MG/ML IJ SOLN
INTRAMUSCULAR | Status: DC | PRN
  Administered 2013-07-17: 100 ug via INTRAVENOUS

## 2013-07-17 MED ORDER — ONDANSETRON HCL 4 MG/2ML IJ SOLN
INTRAMUSCULAR | Status: DC | PRN
  Administered 2013-07-17: 4 mg via INTRAVENOUS

## 2013-07-17 MED ORDER — BUPIVACAINE HCL 0.25 % IJ SOLN
INTRAMUSCULAR | Status: DC | PRN
  Administered 2013-07-17: 20 mL

## 2013-07-17 MED ORDER — HYDROMORPHONE HCL PF 1 MG/ML IJ SOLN
INTRAMUSCULAR | Status: DC | PRN
  Administered 2013-07-17 (×2): 1 mg via INTRAVENOUS

## 2013-07-17 MED ORDER — BUPIVACAINE LIPOSOME 1.3 % IJ SUSP
20.0000 mL | Freq: Once | INTRAMUSCULAR | Status: DC
  Filled 2013-07-17: qty 20

## 2013-07-17 MED ORDER — MENTHOL 3 MG MT LOZG
1.0000 | LOZENGE | OROMUCOSAL | Status: DC | PRN
  Administered 2013-07-17 – 2013-07-19 (×2): 3 mg via ORAL
  Filled 2013-07-17 (×2): qty 9

## 2013-07-17 MED ORDER — DIPHENHYDRAMINE HCL 12.5 MG/5ML PO ELIX: 12.5000 mg | ORAL_SOLUTION | ORAL | Status: DC | PRN

## 2013-07-17 MED ORDER — CHLORHEXIDINE GLUCONATE 4 % EX LIQD: 60.0000 mL | Freq: Once | CUTANEOUS | Status: DC

## 2013-07-17 MED ORDER — LACTATED RINGERS IV SOLN
INTRAVENOUS | Status: DC
Start: 2013-07-17 — End: 2013-07-17
  Administered 2013-07-17: 1000 mL via INTRAVENOUS

## 2013-07-17 SURGICAL SUPPLY — 40 items
BAG ZIPLOCK 12X15 (MISCELLANEOUS) ×4 IMPLANT
BLADE SAW SGTL 18X1.27X75 (BLADE) ×2 IMPLANT
CAPT HIP PF MOP ×2 IMPLANT
CLOTH BEACON ORANGE TIMEOUT ST (SAFETY) ×2 IMPLANT
DECANTER SPIKE VIAL GLASS SM (MISCELLANEOUS) ×2 IMPLANT
DRAPE C-ARM 42X120 X-RAY (DRAPES) ×2 IMPLANT
DRAPE STERI IOBAN 125X83 (DRAPES) ×2 IMPLANT
DRAPE U-SHAPE 47X51 STRL (DRAPES) ×6 IMPLANT
DRSG ADAPTIC 3X8 NADH LF (GAUZE/BANDAGES/DRESSINGS) ×2 IMPLANT
DRSG MEPILEX BORDER 4X4 (GAUZE/BANDAGES/DRESSINGS) ×2 IMPLANT
DRSG MEPILEX BORDER 4X8 (GAUZE/BANDAGES/DRESSINGS) ×2 IMPLANT
DURAPREP 26ML APPLICATOR (WOUND CARE) ×2 IMPLANT
ELECT BLADE 6.5 EXT (BLADE) ×2 IMPLANT
ELECT REM PT RETURN 9FT ADLT (ELECTROSURGICAL) ×2
ELECTRODE REM PT RTRN 9FT ADLT (ELECTROSURGICAL) ×1 IMPLANT
EVACUATOR 1/8 PVC DRAIN (DRAIN) ×2 IMPLANT
FACESHIELD LNG OPTICON STERILE (SAFETY) ×8 IMPLANT
GLOVE BIO SURGEON STRL SZ7.5 (GLOVE) ×2 IMPLANT
GLOVE BIO SURGEON STRL SZ8 (GLOVE) ×4 IMPLANT
GLOVE BIOGEL PI IND STRL 8 (GLOVE) ×2 IMPLANT
GLOVE BIOGEL PI INDICATOR 8 (GLOVE) ×2
GOWN PREVENTION PLUS LG XLONG (DISPOSABLE) ×2 IMPLANT
GOWN STRL REIN XL XLG (GOWN DISPOSABLE) ×2 IMPLANT
KIT BASIN OR (CUSTOM PROCEDURE TRAY) ×2 IMPLANT
NDL SAFETY ECLIPSE 18X1.5 (NEEDLE) ×2 IMPLANT
NEEDLE HYPO 18GX1.5 SHARP (NEEDLE) ×2
PACK TOTAL JOINT (CUSTOM PROCEDURE TRAY) ×2 IMPLANT
PADDING CAST COTTON 6X4 STRL (CAST SUPPLIES) ×2 IMPLANT
SPONGE GAUZE 4X4 12PLY (GAUZE/BANDAGES/DRESSINGS) IMPLANT
STRIP CLOSURE SKIN 1/2X4 (GAUZE/BANDAGES/DRESSINGS) ×2 IMPLANT
SUCTION FRAZIER 12FR DISP (SUCTIONS) IMPLANT
SUT ETHIBOND NAB CT1 #1 30IN (SUTURE) ×2 IMPLANT
SUT MNCRL AB 4-0 PS2 18 (SUTURE) ×2 IMPLANT
SUT VIC AB 2-0 CT1 27 (SUTURE) ×2
SUT VIC AB 2-0 CT1 TAPERPNT 27 (SUTURE) ×2 IMPLANT
SUT VLOC 180 0 24IN GS25 (SUTURE) ×2 IMPLANT
SYR 20CC LL (SYRINGE) ×2 IMPLANT
SYR 50ML LL SCALE MARK (SYRINGE) ×2 IMPLANT
TOWEL OR 17X26 10 PK STRL BLUE (TOWEL DISPOSABLE) ×2 IMPLANT
TRAY FOLEY CATH 14FRSI W/METER (CATHETERS) ×2 IMPLANT

## 2013-07-17 NOTE — Progress Notes (Signed)
Utilization review completed.  

## 2013-07-17 NOTE — Plan of Care (Signed)
Problem: Consults Goal: Diagnosis- Total Joint Replacement Primary Total Hip     

## 2013-07-17 NOTE — Preoperative (Signed)
Beta Blockers   Reason not to administer Beta Blockers:Not Applicable 

## 2013-07-17 NOTE — Transfer of Care (Signed)
Immediate Anesthesia Transfer of Care Note  Patient: Lori [name]  Procedure(s) Performed: Procedure(s): RIGHT TOTAL HIP ARTHROPLASTY ANTERIOR APPROACH (Right)  Patient Location: PACU  Anesthesia Type:General  Level of Consciousness: sedated and patient cooperative  Airway & Oxygen Therapy: Patient Spontanous Breathing and Patient connected to face mask oxygen  Post-op Assessment: Report given to PACU RN and Post -op Vital signs reviewed and stable  Post vital signs: Reviewed and stable  Complications: No apparent anesthesia complications

## 2013-07-17 NOTE — Op Note (Signed)
OPERATIVE REPORT  PREOPERATIVE DIAGNOSIS: Osteoarthritis of the Right hip.   POSTOPERATIVE DIAGNOSIS: Osteoarthritis of the Right  hip.   PROCEDURE: Right total hip arthroplasty, anterior approach.   SURGEON: Ollen Gross, MD   ASSISTANT: Avel Peace, PA-C  ANESTHESIA:  General  ESTIMATED BLOOD LOSS:- 400 ml  DRAINS: Hemovac x1.   COMPLICATIONS: None   CONDITION: PACU - hemodynamically stable.   BRIEF CLINICAL NOTE: Lori [name] is a 79 y.o. female who has advanced end-  stage arthritis of his Right  hip with progressively worsening pain and  dysfunction.The patient has failed nonoperative management and presents for  total hip arthroplasty.   PROCEDURE IN DETAIL: After successful administration of spinal  anesthetic, the traction boots for the [facility] bed were placed on both  feet and the patient was placed onto the [facility] bed, boots placed into the leg  holders. The Right hip was then isolated from the perineum with plastic  drapes and prepped and draped in the usual sterile fashion. ASIS and  greater trochanter were marked and a oblique incision was made, starting  at about 1 cm lateral and 2 cm distal to the ASIS and coursing towards  the anterior cortex of the femur. The skin was cut with a 10 blade  through subcutaneous tissue to the level of the fascia overlying the  tensor fascia lata muscle. The fascia was then incised in line with the  incision at the junction of the anterior third and posterior 2/3rd. The  muscle was teased off the fascia and then the interval between the TFL  and the rectus was developed. The Hohmann retractor was then placed at  the top of the femoral neck over the capsule. The vessels overlying the  capsule were cauterized and the fat on top of the capsule was removed.  A Hohmann retractor was then placed anterior underneath the rectus  femoris to give exposure to the entire anterior capsule. A T-shaped  capsulotomy was  performed. The edges were tagged and the femoral head  was identified.       Osteophytes are removed off the superior acetabulum.  The femoral neck was then cut in situ with an oscillating saw. Traction  was then applied to the left lower extremity utilizing the [facility]  traction. The femoral head was then removed. Retractors were placed  around the acetabulum and then circumferential removal of the labrum was  performed. Osteophytes were also removed. Reaming starts at 45 mm to  medialize and  Increased in 2 mm increments to 47 mm. We reamed in  approximately 40 degrees of abduction, 20 degrees anteversion. A 48 mm  pinnacle acetabular shell was then impacted in anatomic position under  fluoroscopic guidance with excellent purchase. We did not need to place  any additional dome screws. A 29 mm neutral + 4 marathon liner was then  placed into the acetabular shell.       The femoral lift was then placed along the lateral aspect of the femur  just distal to the vastus ridge. The leg was  externally rotated and capsule  was stripped off the inferior aspect of the femoral neck down to the  level of the lesser trochanter, this was done with electrocautery. The femur was lifted after this was performed. The  leg was then placed and extended in adducted position to essentially delivering the femur. We also removed the capsule superiorly and the  piriformis from the piriformis  fossa to gain excellent exposure of the  proximal femur. Rongeur was used to remove some cancellous bone to get  into the lateral portion of the proximal femur for placement of the  initial starter reamer. The starter broaches was placed  the starter broach  and was shown to go down the center of the canal. Broaching  with the  Corail system was then performed starting at size 8, coursing  Up to size 9. A size 9 had excellent torsional and rotational  and axial stability. The trial standard offset neck was then placed  with a 28  + 1.5 trial head. The hip was then reduced. We confirmed that  the stem was in the canal both on AP and lateral x-rays. It also has excellent sizing. The hip was reduced with outstanding stability through full extension, full external rotation,  and then flexion in adduction internal rotation. AP pelvis was taken  and the leg lengths were measured and found to be exactly equal. Hip  was then dislocated again and the femoral head and neck removed. The  femoral broach was removed. Size 9 Corail stem with a standard offset  neck was then impacted into the femur following native anteversion. Has  excellent purchase in the canal. Excellent torsional and rotational and  axial stability. It is confirmed to be in the canal on AP and lateral  fluoroscopic views. The 28 + 1.5 metal head was placed and the hip  reduced with outstanding stability. Again AP pelvis was taken and it  confirmed that the leg lengths were equal. The wound was then copiously  irrigated with saline solution and the capsule reattached and repaired  with Ethibond suture.  20 mL of Exparel mixed with 50 mL of saline then additional 20 ml of .25% Bupivicaine injected into the capsule and into the edge of the tensor fascia lata as well as subcutaneous tissue. The fascia overlying the tensor fascia lata was  then closed with a running #1 V-Loc. Subcu was closed with interrupted  2-0 Vicryl and subcuticular running 4-0 Monocryl. Incision was cleaned  and dried. Steri-Strips and a bulky sterile dressing applied. Hemovac  drain was hooked to suction and then he was awakened and transported to  recovery in stable condition.        Please note that a surgical assistant was a medical necessity for this procedure to perform it in a safe and expeditious manner. Assistant was necessary to provide appropriate retraction of vital neurovascular structures and to prevent femoral fracture and allow for anatomic placement of the prosthesis.  Ollen Gross, M.D.

## 2013-07-17 NOTE — Anesthesia Preprocedure Evaluation (Addendum)
Anesthesia Evaluation  Patient identified by MRN, date of birth, ID band Patient awake    Reviewed: Allergy & Precautions, H&P , NPO status , Patient's Chart, lab work & pertinent test results  History of Anesthesia Complications (+) PROLONGED EMERGENCE and history of anesthetic complications  Airway Mallampati: II TM Distance: >3 FB Neck ROM: Full    Dental no notable dental hx.    Pulmonary neg pulmonary ROS,  CXR: No active cardiopulmonary disease. breath sounds clear to auscultation  Pulmonary exam normal       Cardiovascular Exercise Tolerance: Good hypertension, Pt. on medications and Pt. on home beta blockers Rhythm:Regular Rate:Normal     Neuro/Psych negative neurological ROS  negative psych ROS   GI/Hepatic Neg liver ROS, GERD-  ,  Endo/Other  negative endocrine ROS  Renal/GU negative Renal ROS  negative genitourinary   Musculoskeletal negative musculoskeletal ROS (+)   Abdominal   Peds negative pediatric ROS (+)  Hematology negative hematology ROS (+)   Anesthesia Other Findings   Reproductive/Obstetrics negative OB ROS                          Anesthesia Physical Anesthesia Plan  ASA: II  Anesthesia Plan: General   Post-op Pain Management:    Induction: Intravenous  Airway Management Planned: Oral ETT  Additional Equipment:   Intra-op Plan:   Post-operative Plan: Extubation in OR  Informed Consent: I have reviewed the patients History and Physical, chart, labs and discussed the procedure including the risks, benefits and alternatives for the proposed anesthesia with the patient or authorized representative who has indicated his/her understanding and acceptance.   Dental advisory given  Plan Discussed with: CRNA  Anesthesia Plan Comments: (Discussed r/b general versus spinal. She prefers general.)       Anesthesia Quick Evaluation

## 2013-07-17 NOTE — H&P (View-Only) (Signed)
Lori [name]  DOB: 01/12/1932 Married / Language: English / Race: White Female  Date of Admission:  07/17/2013  Chief Complaint:  Right Hip Pain  History of Present Illness The patient is a 79 year old female who comes in for a preoperative History and Physical. The patient is scheduled for a right total hip arthroplasty (anterior approach) to be performed by Dr. Frank V. Aluisio, MD at [facility] on 07/17/2013. The patient is a 79 year old female who presents for follow up of their hip. The patient is being followed for their right hip pain, osteoarthritis and hamstring pain.  She wanted to discuss the anterior approach total hip and find out what her options are. She decided to not do the intra articular injection with Dr. Ramos. Unfortunately her hip is getting a lot worse. She has pain with most activities. Generally she does fairly well at rest and with only occasional rest pain. She generally sleeps fairly well at night. The pain is mainly in the groin going down the anterior thigh. She is not having back pain. She is not having lower extremity weakness or paresthesia. She is ready to get the hip fixed. They have been treated conservatively in the past for the above stated problem and despite conservative measures, they continue to have progressive pain and severe functional limitations and dysfunction. They have failed non-operative management including home exercise, medications. It is felt that they would benefit from undergoing total joint replacement. Risks and benefits of the procedure have been discussed with the patient and they elect to proceed with surgery. There are no active contraindications to surgery such as ongoing infection or rapidly progressive neurological disease.   Problem List Osteoarthritis, Hip (715.35)   Allergies Codeine Phosphate *ANALGESICS - OPIOID*    Family History Diabetes Mellitus. father and sister Heart Disease.  mother, father, sister, grandfather mothers side and grandfather fathers side Rheumatoid Arthritis. grandfather fathers side Heart disease in female family member before age 65 Heart disease in female family member before age 55 Osteoarthritis. grandmother mothers side and grandfather fathers side    Social History Exercise. Exercises daily; does running / walking and other Illicit drug use. no Living situation. live with spouse Drug/Alcohol Rehab (Previously). no Children. 4 Current work status. retired Drug/Alcohol Rehab (Currently). no Tobacco use. never smoker Tobacco / smoke exposure. no Marital status. married Number of flights of stairs before winded. 4-5 Pain Contract. no Alcohol use. current drinker; drinks wine; 5-7 per week Post-Surgical Plans. home Advance Directives. Lving Will, Healthcare POA    Medication History Tylenol Extra Strength (500MG Tablet, Oral) Active. Metoprolol Tartrate (25MG Tablet, Oral) Active. Simvastatin (40MG Tablet, Oral) Active. Benazepril HCl (10MG Tablet, Oral) Active.   Past Surgical History Tubal Ligation Tonsillectomy Appendectomy Colon Polyp Removal - Colonoscopy  Medical History High blood pressure Hypercholesterolemia Irritable bowel syndrome Impaired Vision Impaired Hearing Varicose veins Hemorrhoids Measles Mumps    Review of Systems General:Not Present- Chills, Fever, Night Sweats, Fatigue, Weight Gain, Weight Loss and Memory Loss. Skin:Not Present- Hives, Itching, Rash, Eczema and Lesions. HEENT:Not Present- Tinnitus, Headache, Double Vision, Visual Loss, Hearing Loss and Dentures. Respiratory:Not Present- Shortness of breath with exertion, Shortness of breath at rest, Allergies, Coughing up blood and Chronic Cough. Cardiovascular:Not Present- Chest Pain, Racing/skipping heartbeats, Difficulty Breathing Lying Down, Murmur, Swelling and Palpitations. Gastrointestinal:Not Present-  Bloody Stool, Heartburn, Abdominal Pain, Vomiting, Nausea, Constipation, Diarrhea, Difficulty Swallowing, Jaundice and Loss of appetitie. Female Genitourinary:Present- Urinary frequency. Not Present- Blood in Urine, Weak   urinary stream, Discharge, Flank Pain, Incontinence, Painful Urination, Urgency, Urinary Retention and Urinating at Night. Musculoskeletal:Present- Muscle Pain, Joint Pain and Morning Stiffness. Not Present- Muscle Weakness, Joint Swelling, Back Pain and Spasms. Neurological:Not Present- Tremor, Dizziness, Blackout spells, Paralysis, Difficulty with balance and Weakness. Psychiatric:Not Present- Insomnia.    Vitals Pulse: 84 (Regular) Resp.: 14 (Unlabored) BP: 118/58 (Sitting, Right Arm, Standard)     Physical Exam The physical exam findings are as follows:   General Mental Status - Alert, cooperative and good historian. General Appearance- pleasant. Not in acute distress. Orientation- Oriented X3. Build & Nutrition- Well nourished and Well developed.   Head and Neck Head- normocephalic, atraumatic . Neck Global Assessment- supple. no bruit auscultated on the right and no bruit auscultated on the left.   Eye Vision- Wears corrective lenses. Pupil- Bilateral- Round. Motion- Bilateral- EOMI.   ENMT  bilateral hearing aids  Chest and Lung Exam Auscultation: Breath sounds:- clear at anterior chest wall and - clear at posterior chest wall. Adventitious sounds:- No Adventitious sounds.   Cardiovascular Auscultation:Rhythm- Regular rate and rhythm. Heart Sounds- S1 WNL and S2 WNL. Murmurs & Other Heart Sounds:Auscultation of the heart reveals - No Murmurs.   Abdomen Palpation/Percussion:Tenderness- Abdomen is non-tender to palpation. Rigidity (guarding)- Abdomen is soft. Auscultation:Auscultation of the abdomen reveals - Bowel sounds normal.   Female Genitourinary Not done, not pertinent to present  illness  Musculoskeletal On exam she is alert and oriented in no apparent distress. Her right hip can be flexed to about 90 with no internal rotation and about 20 external rotation, 20 abduction. Left hip range of motion is normal.  RADIOGRAPHS: Her most recent radiographs, AP pelvis and lateral of the right hip from December, and at that point she had significant bone on bone arthritis with large osteophyte formation.   Assessment & Plan Osteoarthritis, Hip (715.35) Impression: Right Hip  Note: Plan is for a Right Total Hip Replacement - Anterior Approach by Dr. Aluisio.  Plan is to go home with family.  PCP - Dr. C. Alan Ross - Patient has been seen preoperatively and felt to be stable for surgery.  The patient does not have any contraindications and will receive TXA (tranexamic acid) prior to surgery.  Drew Perkins, PA-C  

## 2013-07-17 NOTE — Anesthesia Postprocedure Evaluation (Signed)
  Anesthesia Post-op Note  Patient: Lori [name]  Procedure(s) Performed: Procedure(s) (LRB): RIGHT TOTAL HIP ARTHROPLASTY ANTERIOR APPROACH (Right)  Patient Location: PACU  Anesthesia Type: General  Level of Consciousness: awake and alert   Airway and Oxygen Therapy: Patient Spontanous Breathing  Post-op Pain: mild  Post-op Assessment: Post-op Vital signs reviewed, Patient's Cardiovascular Status Stable, Respiratory Function Stable, Patent Airway and No signs of Nausea or vomiting  Last Vitals:  Filed Vitals:   07/17/13 1522  BP: 110/62  Pulse: 53  Temp: 36.5 C  Resp:     Post-op Vital Signs: stable   Complications: No apparent anesthesia complications

## 2013-07-17 NOTE — Progress Notes (Signed)
Patient has a healing faded rash in Rt groin. Patient states Avel Peace is aware.

## 2013-07-17 NOTE — Interval H&P Note (Signed)
History and Physical Interval Note:  07/17/2013 12:18 PM  Lori [name]  has presented today for surgery, with the diagnosis of OA OF RIGHT HIP  The various methods of treatment have been discussed with the patient and family. After consideration of risks, benefits and other options for treatment, the patient has consented to  Procedure(s): RIGHT TOTAL HIP ARTHROPLASTY ANTERIOR APPROACH (Right) as a surgical intervention .  The patient's history has been reviewed, patient examined, no change in status, stable for surgery.  I have reviewed the patient's chart and labs.  Questions were answered to the patient's satisfaction.     Loanne Drilling

## 2013-07-18 ENCOUNTER — Encounter (HOSPITAL_COMMUNITY): Payer: Self-pay | Admitting: Orthopedic Surgery

## 2013-07-18 DIAGNOSIS — E871 Hypo-osmolality and hyponatremia: Secondary | ICD-10-CM | POA: Diagnosis not present

## 2013-07-18 DIAGNOSIS — D62 Acute posthemorrhagic anemia: Secondary | ICD-10-CM | POA: Diagnosis not present

## 2013-07-18 LAB — BASIC METABOLIC PANEL
Calcium: 8.5 mg/dL (ref 8.4–10.5)
GFR calc Af Amer: >90 mL/min (ref >90)
GFR calc non Af Amer: 86 mL/min — ABNORMAL LOW (ref >90)
Potassium: 4.1 mEq/L (ref 3.5–5.1)
Sodium: 130 mEq/L — ABNORMAL LOW (ref 135–145)

## 2013-07-18 LAB — CBC
Hemoglobin: 8.6 g/dL — ABNORMAL LOW (ref 12.0–15.0)
MCH: 31 pg (ref 26.0–34.0)
MCHC: 34.1 g/dL (ref 30.0–36.0)
MCV: 91 fL (ref 78.0–100.0)
Platelets: 182 10*3/uL (ref 150–400)
RDW: 12.1 % (ref 11.5–15.5)

## 2013-07-18 MED ORDER — POLYSACCHARIDE IRON COMPLEX 150 MG PO CAPS
150.0000 mg | ORAL_CAPSULE | Freq: Two times a day (BID) | ORAL | Status: DC
  Administered 2013-07-18 (×2): 150 mg via ORAL
  Filled 2013-07-18 (×5): qty 1

## 2013-07-18 NOTE — Progress Notes (Signed)
Physical Therapy Treatment Patient Details Name: Lori [name] MRN: [id number] DOB: Apr 16, 1932 Today's Date: 07/18/2013 Time: 4401-0272 PT Time Calculation (min): 23 min  PT Assessment / Plan / Recommendation  History of Present Illness     PT Comments     Follow Up Recommendations  Home health PT     Does the patient have the potential to tolerate intense rehabilitation     Barriers to Discharge        Equipment Recommendations  None recommended by PT    Recommendations for Other Services OT consult  Frequency 7X/week   Progress towards PT Goals Progress towards PT goals: Progressing toward goals  Plan Current plan remains appropriate    Precautions / Restrictions Precautions Precautions: Fall Restrictions Weight Bearing Restrictions: No Other Position/Activity Restrictions: WBAT   Pertinent Vitals/Pain Min c/o pain; premed, ice pack provided    Mobility  Bed Mobility Bed Mobility: Supine to Sit Supine to Sit: 4: Min guard Details for Bed Mobility Assistance: cues for sequence and use of L LE to self assist Transfers Transfers: Sit to Stand;Stand to Sit Sit to Stand: 4: Min guard Stand to Sit: 4: Min guard Details for Transfer Assistance: cues for LE management and use of UEs to self assist Ambulation/Gait Ambulation/Gait Assistance: 4: Min guard Ambulation Distance (Feet): 444 Feet Assistive device: Rolling walker Ambulation/Gait Assistance Details: cues for posture and position from RW Gait Pattern: Step-through pattern;Decreased step length - right;Decreased step length - left;Shuffle Stairs: No    Exercises     PT Diagnosis:    PT Problem List:   PT Treatment Interventions:     PT Goals (current goals can now be found in the care plan section) Acute Rehab PT Goals Patient Stated Goal: Resume previous lifestyle with decreased pain PT Goal Formulation: With patient Time For Goal Achievement: 07/25/13 Potential to Achieve Goals: Good  Visit  Information  Last PT Received On: 07/18/13 Assistance Needed: +1    Subjective Data  Patient Stated Goal: Resume previous lifestyle with decreased pain   Cognition  Cognition Arousal/Alertness: Awake/alert Behavior During Therapy: WFL for tasks assessed/performed Overall Cognitive Status: Within Functional Limits for tasks assessed (cues for safety)    Balance     End of Session PT - End of Session Equipment Utilized During Treatment: Gait belt Activity Tolerance: Patient tolerated treatment well Patient left: in chair;with call bell/phone within reach;with family/visitor present Nurse Communication: Mobility status   GP     Kaiven Vester 07/18/2013, 5:39 PM

## 2013-07-18 NOTE — Care Management Note (Signed)
    Page 1 of 2   07/19/2013     12:55:24 PM   CARE MANAGEMENT NOTE 07/19/2013  Patient:  Lori [name], Lori [name]   Account Number:  [id number]  Date Initiated:  07/18/2013  Documentation initiated by:  Colleen Can  Subjective/Objective Assessment:   DX FAILED UNIPOLAR HEMIARTHROPLASTY; RT TOTAL HIP REPLACEMNT - APPROACH     Action/Plan:   CM SPOKE WITH PATIENT.PLANS ARE FOR PATIENT TO RETURN TO HER HOME  IN [place] WHERE HER 2 DAUGHTERS WILL BE CAREGIVERS. hE HAS POTTY CHAIR. NEEDS RW. [facility] WILL PROVIDE HH SERVICES.   Anticipated DC Date:  07/19/2013   Anticipated DC Plan:  HOME W HOME HEALTH SERVICES  In-house referral  Clinical Social Worker      DC Planning Services  CM consult      [facility] Choice  HOME HEALTH   Choice offered to / List presented to:  C-1 Patient   DME arranged  Levan Hurst      DME agency  [facility]     HH arranged  HH-2 PT      [facility] agency  [facility]   Status of service:  Completed, signed off Medicare Important Message given?  NA - LOS <3 / Initial given by admissions (If response is "NO", the following Medicare IM given date fields will be blank) Date Medicare IM given:   Date Additional Medicare IM given:    Discharge Disposition:  HOME W HOME HEALTH SERVICES  Per UR Regulation:    If discussed at Long Length of Stay Meetings, dates discussed:    Comments:

## 2013-07-18 NOTE — Evaluation (Signed)
Occupational Therapy Evaluation Patient Details Name: Lori [name] MRN: [id number] DOB: Feb 17, 1932 Today's Date: 07/18/2013 Time: 9528-4132 OT Time Calculation (min): 26 min  OT Assessment / Plan / Recommendation History of present illness     Clinical Impression   Pt was admitted for R DA THA.  All education was completed.  Recommend that pt perform sponge bath initially as she has tub only downstairs and no seat; she is a little impulsive but did not have LOB during eval. (when she can get upstairs, she has shower stall/built in seat, and HHPT can review this with her).   Pt will have 24/7 for first week with daughter.      OT Assessment  Patient does not need any further OT services    Follow Up Recommendations  No OT follow up    Barriers to Discharge      Equipment Recommendations  None recommended by OT    Recommendations for Other Services    Frequency       Precautions / Restrictions Precautions Precautions: Fall Restrictions Other Position/Activity Restrictions: WBAT   Pertinent Vitals/Pain Pt denies pain    ADL  Grooming: Wash/dry hands;Supervision/safety Where Assessed - Grooming: Supported standing Upper Body Bathing: Set up Where Assessed - Upper Body Bathing: Unsupported sitting Lower Body Bathing: Minimal assistance Where Assessed - Lower Body Bathing: Supported sit to stand Upper Body Dressing: Set up Where Assessed - Upper Body Dressing: Unsupported sitting Lower Body Dressing: Minimal assistance Where Assessed - Lower Body Dressing: Supported sit to stand Toilet Transfer: Hydrographic surveyor Method: Sit to Barista: Comfort height toilet;Grab bars Toileting - Architect and Hygiene: Supervision/safety Where Assessed - Engineer, mining and Hygiene: Sit on 3-in-1 or toilet Equipment Used: Rolling walker;Reacher Transfers/Ambulation Related to ADLs: ambulated to bathroom with cues for  sequence and safety:  keeping walker in front of her when turning.  Pt practiced sit to stand several times from low bed as she plans to sleep on the couch downstairs.   ADL Comments: Pt is able to reach to ankle.  Interested in Sports administrator for retrieving items from cabinets/closets.  She will have her daughter staying with her initially.  Sister will also help.      OT Diagnosis:    OT Problem List:   OT Treatment Interventions:     OT Goals(Current goals can be found in the care plan section)    Visit Information  Last OT Received On: 07/18/13 Assistance Needed: +1       Prior Functioning     Home Living Family/patient expects to be discharged to:: Private residence Home Equipment: Gilmer Mor - single point;Walker - 4 wheels;Bedside commode Additional Comments: pt has clamp on grab bars in tub; both shower stalls are upstairs Prior Function Level of Independence: Independent Communication Communication: No difficulties         Vision/Perception     Cognition  Cognition Arousal/Alertness: Awake/alert Behavior During Therapy: WFL for tasks assessed/performed Overall Cognitive Status: Within Functional Limits for tasks assessed (cues for safety)    Extremity/Trunk Assessment Upper Extremity Assessment Upper Extremity Assessment: Overall WFL for tasks assessed     Mobility Transfers Sit to Stand: 4: Min guard Stand to Sit: 4: Min guard Details for Transfer Assistance: cues for LE management and use of UEs to self assist     Exercise     Balance     End of Session OT - End of Session Activity Tolerance: Patient tolerated treatment well  Patient left: in chair;with call bell/phone within reach;with family/visitor present  GO     Genola Yuille 07/18/2013, 3:38 PM Marica Otter, OTR/L [phone] 07/18/2013

## 2013-07-18 NOTE — Progress Notes (Addendum)
Inpatient Diabetes Program Recommendations  AACE/ADA: New Consensus Statement on Inpatient Glycemic Control (2013)  Target Ranges:  Prepandial:   less than 140 mg/dL      Peak postprandial:   less than 180 mg/dL (1-2 hours)      Critically ill patients:  140 - 180 mg/dL   Elevated [id number] at 04.5% this hospital admission.  No documentation of diagnosis of diabetes in history nor on problem list on admission nor on any past admissions.  I am glad to talk with patient and family re this result if you would give her this information.  Her glucose levels are high initially thought to be due only to the Decadron.  However the A1C shows otherwise. Hyperglycemia may have been induced by prior long-standing steroid therapy.  May want to repeat the test (?) for accuracy. Thank you, Lenor Coffin, RN, CNS, Diabetes Coordinator [phone])  Inpatient Diabetes Program Recommendations Correction (SSI): Please check cbg's tidwc. CBG's elevated probably due to steroid therapy but HgbA1C at 10.5% (avg glucose in mid 200's)  Suggest a moderate correction scale tidwc and HS to start.  Thank you, Lenor Coffin, RN, CNS, Diabetes Coordinator [phone])

## 2013-07-18 NOTE — Evaluation (Signed)
Physical Therapy Evaluation Patient Details Name: Lori [name] MRN: [id number] DOB: 13-Nov-1931 Today's Date: 07/18/2013 Time: 4098-1191 PT Time Calculation (min): 37 min  PT Assessment / Plan / Recommendation History of Present Illness     Clinical Impression  Pt s/p R THR presents with decreased R LE strength/ROM and post op pain limiting functional mobility.  Pt should progress to d/c home with family assist and HHPT follow up.    PT Assessment  Patient needs continued PT services    Follow Up Recommendations  Home health PT    Does the patient have the potential to tolerate intense rehabilitation      Barriers to Discharge        Equipment Recommendations  None recommended by PT    Recommendations for Other Services OT consult   Frequency 7X/week    Precautions / Restrictions Precautions Precautions: Fall Restrictions Weight Bearing Restrictions: No Other Position/Activity Restrictions: WBAT   Pertinent Vitals/Pain 3/10; premed, ice pack provided      Mobility  Bed Mobility Bed Mobility: Supine to Sit Supine to Sit: 4: Min assist Details for Bed Mobility Assistance: cues for sequence and use of L LE to self assist Transfers Transfers: Sit to Stand;Stand to Sit Sit to Stand: 4: Min assist Stand to Sit: 4: Min assist Details for Transfer Assistance: cues for LE management and use of UEs to self assist Ambulation/Gait Ambulation/Gait Assistance: 4: Min assist Ambulation Distance (Feet): 200 Feet Assistive device: Rolling walker Ambulation/Gait Assistance Details: cues for initial sequence, posture and position from RW Gait Pattern: Step-to pattern;Step-through pattern;Decreased step length - right;Decreased step length - left;Shuffle;Trunk flexed    Exercises Total Joint Exercises Ankle Circles/Pumps: AROM;10 reps;Supine;Both Quad Sets: AROM;Both;10 reps;Supine Heel Slides: AAROM;15 reps;Supine;Right Hip ABduction/ADduction: AAROM;Right;Supine;10 reps    PT Diagnosis: Difficulty walking  PT Problem List: Decreased strength;Decreased range of motion;Decreased activity tolerance;Decreased mobility;Decreased knowledge of use of DME;Pain;Decreased knowledge of precautions PT Treatment Interventions: DME instruction;Gait training;Stair training;Functional mobility training;Therapeutic activities;Therapeutic exercise;Patient/family education     PT Goals(Current goals can be found in the care plan section) Acute Rehab PT Goals Patient Stated Goal: Resume previous lifestyle with decreased pain PT Goal Formulation: With patient Time For Goal Achievement: 07/25/13 Potential to Achieve Goals: Good  Visit Information  Last PT Received On: 07/18/13 Assistance Needed: +1       Prior Functioning  Home Living Family/patient expects to be discharged to:: Private residence Living Arrangements: Spouse/significant other Available Help at Discharge: Family Type of Home: House Home Access: Stairs to enter Secretary/administrator of Steps: 3 Entrance Stairs-Rails: Right;Left Home Layout: Two level Alternate Level Stairs-Number of Steps: 18 Alternate Level Stairs-Rails: Right Home Equipment: Cane - single point;Walker - 4 wheels;Bedside commode Prior Function Level of Independence: Independent Communication Communication: No difficulties Dominant Hand: Right    Cognition  Cognition Arousal/Alertness: Awake/alert Behavior During Therapy: WFL for tasks assessed/performed Overall Cognitive Status: Within Functional Limits for tasks assessed    Extremity/Trunk Assessment Upper Extremity Assessment Upper Extremity Assessment: Overall WFL for tasks assessed Lower Extremity Assessment Lower Extremity Assessment: RLE deficits/detail RLE Deficits / Details: 2/5 hip strength with AAROM at hip to 100 flex and 20 abd   Balance    End of Session PT - End of Session Equipment Utilized During Treatment: Gait belt Activity Tolerance: Patient tolerated  treatment well Patient left: in chair;with call bell/phone within reach;with nursing/sitter in room;with family/visitor present Nurse Communication: Mobility status  GP     Juan Olthoff 07/18/2013, 11:18 AM

## 2013-07-18 NOTE — Progress Notes (Signed)
   Subjective: 1 Day Post-Op Procedure(s) (LRB): RIGHT TOTAL HIP ARTHROPLASTY ANTERIOR APPROACH (Right) Patient reports pain as mild.   Patient seen in rounds with Dr. Lequita Halt.  Daughter in room at bedside. Patient is well, and has had no acute complaints or problems We will start therapy today.  Plan is to go Home after hospital stay.  Objective: Vital signs in last 24 hours: Temp:  [96.5 F (35.8 C)-98.4 F (36.9 C)] 98.4 F (36.9 C) (10/23 0702) Pulse Rate:  [50-75] 75 (10/23 0702) Resp:  [8-20] 16 (10/23 0702) BP: (90-129)/(35-68) 104/64 mmHg (10/23 0702) SpO2:  [93 %-100 %] 100 % (10/23 0702) Weight:  [55.509 kg (122 lb 6 oz)] 55.509 kg (122 lb 6 oz) (10/22 1620)  Intake/Output from previous day:  Intake/Output Summary (Last 24 hours) at 07/18/13 0747 Last data filed at 07/18/13 0709  Gross per 24 hour  Intake 4097.5 ml  Output   3755 ml  Net  342.5 ml    Intake/Output this shift: Total I/O In: -  Out: 1900 [Urine:1900]  Labs:  Recent Labs  07/18/13 0443  HGB 8.6*    Recent Labs  07/18/13 0443  WBC 10.2  RBC 2.77*  HCT 25.2*  PLT 182    Recent Labs  07/17/13 0945 07/18/13 0443  NA  --  130*  K 4.2 4.1  CL  --  98  CO2  --  25  BUN  --  9  CREATININE  --  0.57  GLUCOSE  --  192*  CALCIUM  --  8.5   No results found for this basename: LABPT, INR,  in the last 72 hours  EXAM General - Patient is Alert, Appropriate and Oriented Extremity - Neurovascular intact Sensation intact distally Dorsiflexion/Plantar flexion intact No cellulitis present Dressing - dressing C/D/I Motor Function - intact, moving foot and toes well on exam.  Hemovac pulled without difficulty.  Past Medical History  Diagnosis Date  . Complication of anesthesia     SLOW TO WAKE UP   . Hypertension     Can get hypotensive at times per patient  . Palpitations   . Arthritis   . Rash     rt groin  . Frequent bowel movements   . GERD (gastroesophageal reflux  disease)     10 lb wt loss past 3 yrs  . Anemia     Assessment/Plan: 1 Day Post-Op Procedure(s) (LRB): RIGHT TOTAL HIP ARTHROPLASTY ANTERIOR APPROACH (Right) Principal Problem:   OA (osteoarthritis) of hip Active Problems:   Hyponatremia   Postoperative anemia due to acute blood loss  Estimated body mass index is 22.01 kg/(m^2) as calculated from the following:   Height as of this encounter: 5' 2.5" (1.588 m).   Weight as of this encounter: 55.509 kg (122 lb 6 oz). Advance diet Up with therapy Plan for discharge tomorrow Discharge home with home health  DVT Prophylaxis - Xarelto Weight Bearing As Tolerated right Leg Hemovac Pulled Begin Therapy   Jorel Gravlin 07/18/2013, 7:47 AM

## 2013-07-19 LAB — CBC
HCT: 26.5 % — ABNORMAL LOW (ref 36.0–46.0)
Hemoglobin: 9.1 g/dL — ABNORMAL LOW (ref 12.0–15.0)
MCH: 31.5 pg (ref 26.0–34.0)
MCV: 91.7 fL (ref 78.0–100.0)
Platelets: 210 10*3/uL (ref 150–400)
RBC: 2.89 MIL/uL — ABNORMAL LOW (ref 3.87–5.11)
WBC: 15.5 10*3/uL — ABNORMAL HIGH (ref 4.0–10.5)

## 2013-07-19 LAB — BASIC METABOLIC PANEL
CO2: 29 mEq/L (ref 19–32)
Calcium: 9.5 mg/dL (ref 8.4–10.5)
Chloride: 102 mEq/L (ref 96–112)
Creatinine, Ser: 0.53 mg/dL (ref 0.50–1.10)
Glucose, Bld: 140 mg/dL — ABNORMAL HIGH (ref 70–99)
Potassium: 3.9 mEq/L (ref 3.5–5.1)
Sodium: 139 mEq/L (ref 135–145)

## 2013-07-19 MED ORDER — METHOCARBAMOL 500 MG PO TABS: 500.0000 mg | ORAL_TABLET | Freq: Four times a day (QID) | ORAL | Status: DC | PRN

## 2013-07-19 MED ORDER — RIVAROXABAN 10 MG PO TABS: 10.0000 mg | ORAL_TABLET | Freq: Every day | ORAL | Status: DC

## 2013-07-19 MED ORDER — OXYCODONE HCL 5 MG PO TABS: 5.0000 mg | ORAL_TABLET | ORAL | Status: DC | PRN

## 2013-07-19 MED ORDER — TRAMADOL HCL 50 MG PO TABS: 50.0000 mg | ORAL_TABLET | Freq: Four times a day (QID) | ORAL | Status: DC | PRN

## 2013-07-19 MED ORDER — POLYSACCHARIDE IRON COMPLEX 150 MG PO CAPS: 150.0000 mg | ORAL_CAPSULE | Freq: Two times a day (BID) | ORAL | Status: DC

## 2013-07-19 NOTE — Progress Notes (Signed)
Physical Therapy Treatment Patient Details Name: Lori [name] MRN: [id number] DOB: 1932/06/20 Today's Date: 07/19/2013 Time: 0810-0910 PT Time Calculation (min): 60 min  PT Assessment / Plan / Recommendation  History of Present Illness     PT Comments   Reviewed car transfers, stairs and bed transfers from both sides with pt and dtr.  Follow Up Recommendations  Home health PT     Does the patient have the potential to tolerate intense rehabilitation     Barriers to Discharge        Equipment Recommendations  None recommended by PT    Recommendations for Other Services OT consult  Frequency 7X/week   Progress towards PT Goals Progress towards PT goals: Progressing toward goals  Plan Current plan remains appropriate    Precautions / Restrictions Precautions Precautions: Fall Restrictions Weight Bearing Restrictions: No Other Position/Activity Restrictions: WBAT   Pertinent Vitals/Pain 3/10    Mobility  Bed Mobility Bed Mobility: Supine to Sit;Sit to Supine Supine to Sit: 4: Min assist;4: Min guard Sit to Supine: 4: Min assist;5: Supervision Details for Bed Mobility Assistance: cues for sequence and use of L LE to self assist; min assist on L side of bed; S with VC on R side Transfers Transfers: Sit to Stand;Stand to Sit Sit to Stand: 4: Min assist Stand to Sit: 5: Supervision Details for Transfer Assistance: cues for LE management and use of UEs to self assist Ambulation/Gait Ambulation/Gait Assistance: 4: Min guard;5: Supervision Ambulation Distance (Feet): 444 Feet Assistive device: Rolling walker Ambulation/Gait Assistance Details: min cues for position from RW and posture Gait Pattern: Step-through pattern Stairs: Yes Stairs Assistance: 4: Min assist Stairs Assistance Details (indicate cue type and reason): cues for sequence and foot/RW placement Stair Management Technique: One rail Right;Step to pattern;Forwards;With cane Number of Stairs: 8     Exercises Total Joint Exercises Ankle Circles/Pumps: AROM;10 reps;Supine;Both Quad Sets: AROM;Both;10 reps;Supine Gluteal Sets: AROM;Both;10 reps;Supine Heel Slides: AAROM;Supine;Right;20 reps Hip ABduction/ADduction: AAROM;Right;Supine;20 reps   PT Diagnosis:    PT Problem List:   PT Treatment Interventions:     PT Goals (current goals can now be found in the care plan section) Acute Rehab PT Goals Patient Stated Goal: Resume previous lifestyle with decreased pain PT Goal Formulation: With patient Time For Goal Achievement: 07/25/13 Potential to Achieve Goals: Good  Visit Information  Last PT Received On: 07/19/13 Assistance Needed: +1    Subjective Data  Patient Stated Goal: Resume previous lifestyle with decreased pain   Cognition  Cognition Arousal/Alertness: Awake/alert Behavior During Therapy: WFL for tasks assessed/performed Overall Cognitive Status: Within Functional Limits for tasks assessed    Balance     End of Session PT - End of Session Equipment Utilized During Treatment: Gait belt Activity Tolerance: Patient tolerated treatment well Patient left: in chair;with call bell/phone within reach;with family/visitor present Nurse Communication: Mobility status   GP     Lori [name] 07/19/2013, 2:26 PM

## 2013-07-19 NOTE — Discharge Summary (Signed)
Physician Discharge Summary   Patient ID: Lori [name] MRN: [id number] DOB/AGE: 1931/12/05 79 y.o.  Admit date: 07/17/2013 Discharge date: 07/19/2013  Primary Diagnosis: Osteoarthritis of the Right hip.   Admission Diagnoses:  Past Medical History  Diagnosis Date  . Complication of anesthesia     SLOW TO WAKE UP   . Hypertension     Can get hypotensive at times per patient  . Palpitations   . Arthritis   . Rash     rt groin  . Frequent bowel movements   . GERD (gastroesophageal reflux disease)     10 lb wt loss past 3 yrs  . Anemia    Discharge Diagnoses:   Principal Problem:   OA (osteoarthritis) of hip Active Problems:   Hyponatremia   Postoperative anemia due to acute blood loss  Estimated body mass index is 22.01 kg/(m^2) as calculated from the following:   Height as of this encounter: 5' 2.5" (1.588 m).   Weight as of this encounter: 55.509 kg (122 lb 6 oz).  Procedure(s) (LRB): RIGHT TOTAL HIP ARTHROPLASTY ANTERIOR APPROACH (Right)   Consults: None  HPI: Lori [name] is a 79 y.o. female who has advanced end-  stage arthritis of his Right hip with progressively worsening pain and  dysfunction.The patient has failed nonoperative management and presents for  total hip arthroplasty.   Laboratory Data: Admission on 07/17/2013, Discharged on 07/19/2013  Component Date Value Range Status  . Potassium 07/17/2013 4.2  3.5 - 5.1 mEq/L Final  . WBC 07/18/2013 10.2  4.0 - 10.5 K/uL Final  . RBC 07/18/2013 2.77* 3.87 - 5.11 MIL/uL Final  . Hemoglobin 07/18/2013 8.6* 12.0 - 15.0 g/dL Final  . HCT 40/98/1191 25.2* 36.0 - 46.0 % Final  . MCV 07/18/2013 91.0  78.0 - 100.0 fL Final  . MCH 07/18/2013 31.0  26.0 - 34.0 pg Final  . MCHC 07/18/2013 34.1  30.0 - 36.0 g/dL Final  . RDW 47/82/9562 12.1  11.5 - 15.5 % Final  . Platelets 07/18/2013 182  150 - 400 K/uL Final  . Sodium 07/18/2013 130* 135 - 145 mEq/L Final  . Potassium 07/18/2013 4.1  3.5 - 5.1 mEq/L  Final  . Chloride 07/18/2013 98  96 - 112 mEq/L Final  . CO2 07/18/2013 25  19 - 32 mEq/L Final  . Glucose, Bld 07/18/2013 192* 70 - 99 mg/dL Final  . BUN 13/04/6577 9  6 - 23 mg/dL Final  . Creatinine, Ser 07/18/2013 0.57  0.50 - 1.10 mg/dL Final  . Calcium 46/96/2952 8.5  8.4 - 10.5 mg/dL Final  . GFR calc non Af Amer 07/18/2013 86* >90 mL/min Final  . GFR calc Af Amer 07/18/2013 >90  >90 mL/min Final   Comment: (NOTE)                          The eGFR has been calculated using the CKD EPI equation.                          This calculation has not been validated in all clinical situations.                          eGFR's persistently <90 mL/min signify possible Chronic Kidney  Disease.  . WBC 07/19/2013 15.5* 4.0 - 10.5 K/uL Final  . RBC 07/19/2013 2.89* 3.87 - 5.11 MIL/uL Final  . Hemoglobin 07/19/2013 9.1* 12.0 - 15.0 g/dL Final  . HCT 16/06/9603 26.5* 36.0 - 46.0 % Final  . MCV 07/19/2013 91.7  78.0 - 100.0 fL Final  . MCH 07/19/2013 31.5  26.0 - 34.0 pg Final  . MCHC 07/19/2013 34.3  30.0 - 36.0 g/dL Final  . RDW 54/05/8118 12.7  11.5 - 15.5 % Final  . Platelets 07/19/2013 210  150 - 400 K/uL Final  . Sodium 07/19/2013 139  135 - 145 mEq/L Final   DELTA CHECK NOTED  . Potassium 07/19/2013 3.9  3.5 - 5.1 mEq/L Final  . Chloride 07/19/2013 102  96 - 112 mEq/L Final  . CO2 07/19/2013 29  19 - 32 mEq/L Final  . Glucose, Bld 07/19/2013 140* 70 - 99 mg/dL Final  . BUN 14/78/2956 10  6 - 23 mg/dL Final  . Creatinine, Ser 07/19/2013 0.53  0.50 - 1.10 mg/dL Final  . Calcium 21/30/8657 9.5  8.4 - 10.5 mg/dL Final  . GFR calc non Af Amer 07/19/2013 88* >90 mL/min Final  . GFR calc Af Amer 07/19/2013 >90  >90 mL/min Final   Comment: (NOTE)                          The eGFR has been calculated using the CKD EPI equation.                          This calculation has not been validated in all clinical situations.                          eGFR's persistently <90  mL/min signify possible Chronic Kidney                          Disease.  Hospital Outpatient Visit on 07/10/2013  Component Date Value Range Status  . aPTT 07/10/2013 31  24 - 37 seconds Final  . WBC 07/10/2013 5.3  4.0 - 10.5 K/uL Final  . RBC 07/10/2013 4.22  3.87 - 5.11 MIL/uL Final  . Hemoglobin 07/10/2013 13.1  12.0 - 15.0 g/dL Final  . HCT 84/69/6295 39.3  36.0 - 46.0 % Final  . MCV 07/10/2013 93.1  78.0 - 100.0 fL Final  . MCH 07/10/2013 31.0  26.0 - 34.0 pg Final  . MCHC 07/10/2013 33.3  30.0 - 36.0 g/dL Final  . RDW 28/41/3244 12.5  11.5 - 15.5 % Final  . Platelets 07/10/2013 282  150 - 400 K/uL Final  . Sodium 07/10/2013 139  135 - 145 mEq/L Final  . Potassium 07/10/2013 5.8* 3.5 - 5.1 mEq/L Final  . Chloride 07/10/2013 103  96 - 112 mEq/L Final  . CO2 07/10/2013 29  19 - 32 mEq/L Final  . Glucose, Bld 07/10/2013 93  70 - 99 mg/dL Final  . BUN 09/28/7251 13  6 - 23 mg/dL Final  . Creatinine, Ser 07/10/2013 0.63  0.50 - 1.10 mg/dL Final  . Calcium 66/44/0347 10.5  8.4 - 10.5 mg/dL Final  . Total Protein 07/10/2013 7.2  6.0 - 8.3 g/dL Final  . Albumin 42/59/5638 4.1  3.5 - 5.2 g/dL Final  . AST 75/64/3329 18  0 - 37 U/L Final  . ALT 07/10/2013 13  0 -  35 U/L Final  . Alkaline Phosphatase 07/10/2013 50  39 - 117 U/L Final  . Total Bilirubin 07/10/2013 0.3  0.3 - 1.2 mg/dL Final  . GFR calc non Af Amer 07/10/2013 83* >90 mL/min Final  . GFR calc Af Amer 07/10/2013 >90  >90 mL/min Final   Comment: (NOTE)                          The eGFR has been calculated using the CKD EPI equation.                          This calculation has not been validated in all clinical situations.                          eGFR's persistently <90 mL/min signify possible Chronic Kidney                          Disease.  Marland Kitchen Prothrombin Time 07/10/2013 12.4  11.6 - 15.2 seconds Final  . INR 07/10/2013 0.94  0.00 - 1.49 Final  . Color, Urine 07/10/2013 YELLOW  YELLOW Final  . APPearance 07/10/2013  CLEAR  CLEAR Final  . Specific Gravity, Urine 07/10/2013 1.013  1.005 - 1.030 Final  . pH 07/10/2013 7.0  5.0 - 8.0 Final  . Glucose, UA 07/10/2013 NEGATIVE  NEGATIVE mg/dL Final  . Hgb urine dipstick 07/10/2013 NEGATIVE  NEGATIVE Final  . Bilirubin Urine 07/10/2013 NEGATIVE  NEGATIVE Final  . Ketones, ur 07/10/2013 NEGATIVE  NEGATIVE mg/dL Final  . Protein, ur 96/12/5407 NEGATIVE  NEGATIVE mg/dL Final  . Urobilinogen, UA 07/10/2013 0.2  0.0 - 1.0 mg/dL Final  . Nitrite 81/19/1478 NEGATIVE  NEGATIVE Final  . Leukocytes, UA 07/10/2013 SMALL* NEGATIVE Final  . MRSA, PCR 07/10/2013 NEGATIVE  NEGATIVE Final  . Staphylococcus aureus 07/10/2013 NEGATIVE  NEGATIVE Final   Comment:                                 The Xpert SA Assay (FDA                          approved for NASAL specimens                          in patients over 12 years of age),                          is one component of                          a comprehensive surveillance                          program.  Test performance has                          been validated by [employer] for patients greater  than or equal to 65 year old.                          It is not intended                          to diagnose infection nor to                          guide or monitor treatment.  . ABO/RH(D) 07/10/2013 A POS   Final  . Antibody Screen 07/10/2013 NEG   Final  . Sample Expiration 07/10/2013 07/20/2013   Final  . ABO/RH(D) 07/10/2013 A POS   Final  . WBC, UA 07/10/2013 0-2  <3 WBC/hpf Final     X-Rays:Dg Chest 2 View  07/10/2013   CLINICAL DATA:  Hypertension.  EXAM: CHEST  2 VIEW  COMPARISON:  None.  FINDINGS: The heart size and mediastinal contours are within normal limits. Both lungs are clear. The visualized skeletal structures are unremarkable.  IMPRESSION: No active cardiopulmonary disease.   Electronically Signed   By: Roque Lias M.D.   On: 07/10/2013 12:18    Dg Hip Complete Right  07/10/2013   CLINICAL DATA:  Right hip pain.  EXAM: RIGHT HIP - COMPLETE 2+ VIEW  COMPARISON:  None.  FINDINGS: No fracture or dislocation is noted. Severe joint space narrowing and osteophyte formation is seen involving the right hip joint.  IMPRESSION: Severe degenerative joint disease of right hip.   Electronically Signed   By: Roque Lias M.D.   On: 07/10/2013 12:17   Dg Pelvis Portable  07/17/2013   CLINICAL DATA:  Postop right total hip  EXAM: PORTABLE PELVIS  COMPARISON:  None.  FINDINGS: Right total hip arthroplasty in satisfactory position.  Associated subcutaneous gas and surgical drain.  No fracture or dislocation is seen.  Visualized bony pelvis appears intact.  IMPRESSION: Right total hip arthroplasty in satisfactory position.   Electronically Signed   By: Charline Bills M.D.   On: 07/17/2013 15:20   Dg C-arm 1-60 Min-no Report  07/17/2013   CLINICAL DATA: Right total hip replacement   C-ARM 1-60 MINUTES  Fluoroscopy was utilized by the requesting physician.  No radiographic  interpretation.     EKG:No orders found for this or any previous visit.   Hospital Course: Patient was admitted to [facility] and taken to the OR and underwent the above state procedure without complications.  Patient tolerated the procedure well and was later transferred to the recovery room and then to the orthopaedic floor for postoperative care.  They were given PO and IV analgesics for pain control following their surgery.  They were given 24 hours of postoperative antibiotics of  Anti-infectives   Start     Dose/Rate Route Frequency Ordered Stop   07/17/13 1800  fluconazole (DIFLUCAN) tablet 100 mg  Status:  Discontinued     100 mg Oral Daily 07/17/13 1534 07/19/13 1304   07/17/13 1800  ceFAZolin (ANCEF) IVPB 1 g/50 mL premix     1 g 100 mL/hr over 30 Minutes Intravenous Every 6 hours 07/17/13 1534 07/18/13 0114   07/17/13 0915  ceFAZolin (ANCEF) IVPB 2 g/50 mL  premix     2 g 100 mL/hr over 30 Minutes Intravenous On call to O.R. 07/17/13 0913 07/17/13 1238     and started on DVT prophylaxis in the form  of Xarelto.   PT and OT were ordered for total hip protocol.  The patient was allowed to be WBAT with therapy. Discharge planning was consulted to help with postop disposition and equipment needs.  Patient had a decent night on the evening of surgery.  They started to get up OOB with therapy on day one.  Hemovac drain was pulled without difficulty.  Continued to work with therapy into day two.  Dressing was changed on day two and the incision was healing well.  Patient was seen in rounds and was ready to go home later that day  Discharge home with home health  Diet - Cardiac diet  Follow up - in 2 weeks  Activity - WBAT  Disposition - Home  Condition Upon Discharge - Good  D/C Meds - See DC Summary  DVT Prophylaxis - Xarelto       Discharge Orders   Future Orders Complete By Expires   Call MD / Call 911  As directed    Comments:     If you experience chest pain or shortness of breath, CALL 911 and be transported to the hospital emergency room.  If you develope a fever above 101 F, pus (white drainage) or increased drainage or redness at the wound, or calf pain, call your surgeon's office.   Change dressing  As directed    Comments:     You may change your dressing dressing daily with sterile 4 x 4 inch gauze dressing and paper tape.  Do not submerge the incision under water.   Constipation Prevention  As directed    Comments:     Drink plenty of fluids.  Prune juice may be helpful.  You may use a stool softener, such as Colace (over the counter) 100 mg twice a day.  Use MiraLax (over the counter) for constipation as needed.   Diet - low sodium heart healthy  As directed    Discharge instructions  As directed    Comments:     Pick up stool softner and laxative for home. Do not submerge incision under water. May shower. Continue to use ice  for pain and swelling from surgery.  Total Hip Protocol.  Take Xarelto for two and a half more weeks, then discontinue Xarelto. Once the patient has completed the Xarelto, they may resume the 81 mg Aspirin.   Do not sit on low chairs, stoools or toilet seats, as it may be difficult to get up from low surfaces  As directed    Driving restrictions  As directed    Comments:     No driving until released by the physician.   Increase activity slowly as tolerated  As directed    Lifting restrictions  As directed    Comments:     No lifting until released by the physician.   Patient may shower  As directed    Comments:     You may shower without a dressing once there is no drainage.  Do not wash over the wound.  If drainage remains, do not shower until drainage stops.   TED hose  As directed    Comments:     Use stockings (TED hose) for 3 weeks on both leg(s).  You may remove them at night for sleeping.   Weight bearing as tolerated  As directed    Questions:     Laterality:     Extremity:         Medication List  STOP taking these medications       aspirin EC 81 MG tablet     fluconazole 100 MG tablet  Commonly known as:  DIFLUCAN      TAKE these medications       acetaminophen 500 MG tablet  Commonly known as:  TYLENOL  Take 1,000 mg by mouth every 6 (six) hours as needed for pain.     benazepril 10 MG tablet  Commonly known as:  LOTENSIN  Take 10 mg by mouth every morning.     methocarbamol 500 MG tablet  Commonly known as:  ROBAXIN  Take 1 tablet (500 mg total) by mouth every 6 (six) hours as needed.     metoprolol tartrate 25 MG tablet  Commonly known as:  LOPRESSOR  Take 12.5 mg by mouth 2 (two) times daily.     rivaroxaban 10 MG Tabs tablet  Commonly known as:  XARELTO  - Take 1 tablet (10 mg total) by mouth daily with breakfast. Take Xarelto for two and a half more weeks, then discontinue Xarelto.  - Once the patient has completed the Xarelto, they may  resume the 81 mg Aspirin.     simvastatin 40 MG tablet  Commonly known as:  ZOCOR  Take 20 mg by mouth every evening.     UNISOM 25 MG tablet  Generic drug:  doxylamine (Sleep)  Take 25 mg by mouth at bedtime as needed for sleep.       Follow-up Information   Follow up with Loanne Drilling, MD. Schedule an appointment as soon as possible for a visit on 08/01/2013. (Call [phone] Monday to make the appointment)    Specialty:  Orthopedic Surgery   Contact information:   [address] [place] [address] [phone]       Signed: Patrica Duel 08/01/2013, 9:20 AM

## 2013-07-19 NOTE — Progress Notes (Signed)
   Subjective: 2 Days Post-Op Procedure(s) (LRB): RIGHT TOTAL HIP ARTHROPLASTY ANTERIOR APPROACH (Right) Patient reports pain as mild.   Patient seen in rounds with Dr. Lequita Halt. Patient is well, and has had no acute complaints or problems Patient is ready to go home   Objective: Vital signs in last 24 hours: Temp:  [97.7 F (36.5 C)-98.1 F (36.7 C)] 98.1 F (36.7 C) (10/24 0545) Pulse Rate:  [84-85] 84 (10/24 0545) Resp:  [16] 16 (10/24 0545) BP: (105-131)/(52-63) 121/63 mmHg (10/24 0545) SpO2:  [93 %-97 %] 95 % (10/24 0545)  Intake/Output from previous day:  Intake/Output Summary (Last 24 hours) at 07/19/13 0844 Last data filed at 07/18/13 2000  Gross per 24 hour  Intake    480 ml  Output   8150 ml  Net  -7670 ml    Intake/Output this shift:    Labs:  Recent Labs  07/18/13 0443 07/19/13 0433  HGB 8.6* 9.1*    Recent Labs  07/18/13 0443 07/19/13 0433  WBC 10.2 15.5*  RBC 2.77* 2.89*  HCT 25.2* 26.5*  PLT 182 210    Recent Labs  07/18/13 0443 07/19/13 0433  NA 130* 139  K 4.1 3.9  CL 98 102  CO2 25 29  BUN 9 10  CREATININE 0.57 0.53  GLUCOSE 192* 140*  CALCIUM 8.5 9.5   No results found for this basename: LABPT, INR,  in the last 72 hours  EXAM: General - Patient is Alert, Appropriate and Oriented Extremity - Neurovascular intact Sensation intact distally Dorsiflexion/Plantar flexion intact No cellulitis present Incision - clean, dry, no drainage, healing Motor Function - intact, moving foot and toes well on exam.   Assessment/Plan: 2 Days Post-Op Procedure(s) (LRB): RIGHT TOTAL HIP ARTHROPLASTY ANTERIOR APPROACH (Right) Procedure(s) (LRB): RIGHT TOTAL HIP ARTHROPLASTY ANTERIOR APPROACH (Right) Past Medical History  Diagnosis Date  . Complication of anesthesia     SLOW TO WAKE UP   . Hypertension     Can get hypotensive at times per patient  . Palpitations   . Arthritis   . Rash     rt groin  . Frequent bowel movements   .  GERD (gastroesophageal reflux disease)     10 lb wt loss past 3 yrs  . Anemia    Principal Problem:   OA (osteoarthritis) of hip Active Problems:   Hyponatremia   Postoperative anemia due to acute blood loss  Estimated body mass index is 22.01 kg/(m^2) as calculated from the following:   Height as of this encounter: 5' 2.5" (1.588 m).   Weight as of this encounter: 55.509 kg (122 lb 6 oz). Up with therapy Discharge home with home health Diet - Cardiac diet Follow up - in 2 weeks Activity - WBAT Disposition - Home Condition Upon Discharge - Good D/C Meds - See DC Summary DVT Prophylaxis - Xarelto  Nyia Tsao 07/19/2013, 8:44 AM

## 2013-07-20 DIAGNOSIS — IMO0001 Reserved for inherently not codable concepts without codable children: Secondary | ICD-10-CM | POA: Diagnosis not present

## 2013-07-20 DIAGNOSIS — Z96649 Presence of unspecified artificial hip joint: Secondary | ICD-10-CM | POA: Diagnosis not present

## 2013-07-20 DIAGNOSIS — I10 Essential (primary) hypertension: Secondary | ICD-10-CM | POA: Diagnosis not present

## 2013-07-20 DIAGNOSIS — R404 Transient alteration of awareness: Secondary | ICD-10-CM | POA: Diagnosis not present

## 2013-07-20 DIAGNOSIS — Z471 Aftercare following joint replacement surgery: Secondary | ICD-10-CM | POA: Diagnosis not present

## 2013-07-20 DIAGNOSIS — Z4800 Encounter for change or removal of nonsurgical wound dressing: Secondary | ICD-10-CM | POA: Diagnosis not present

## 2013-07-20 DIAGNOSIS — R55 Syncope and collapse: Secondary | ICD-10-CM | POA: Diagnosis not present

## 2013-07-22 DIAGNOSIS — Z4800 Encounter for change or removal of nonsurgical wound dressing: Secondary | ICD-10-CM | POA: Diagnosis not present

## 2013-07-22 DIAGNOSIS — IMO0001 Reserved for inherently not codable concepts without codable children: Secondary | ICD-10-CM | POA: Diagnosis not present

## 2013-07-22 DIAGNOSIS — I10 Essential (primary) hypertension: Secondary | ICD-10-CM | POA: Diagnosis not present

## 2013-07-22 DIAGNOSIS — Z471 Aftercare following joint replacement surgery: Secondary | ICD-10-CM | POA: Diagnosis not present

## 2013-07-22 DIAGNOSIS — Z96649 Presence of unspecified artificial hip joint: Secondary | ICD-10-CM | POA: Diagnosis not present

## 2013-07-23 DIAGNOSIS — Z96649 Presence of unspecified artificial hip joint: Secondary | ICD-10-CM | POA: Diagnosis not present

## 2013-07-23 DIAGNOSIS — I10 Essential (primary) hypertension: Secondary | ICD-10-CM | POA: Diagnosis not present

## 2013-07-23 DIAGNOSIS — Z4800 Encounter for change or removal of nonsurgical wound dressing: Secondary | ICD-10-CM | POA: Diagnosis not present

## 2013-07-23 DIAGNOSIS — Z471 Aftercare following joint replacement surgery: Secondary | ICD-10-CM | POA: Diagnosis not present

## 2013-07-23 DIAGNOSIS — IMO0001 Reserved for inherently not codable concepts without codable children: Secondary | ICD-10-CM | POA: Diagnosis not present

## 2013-07-24 DIAGNOSIS — IMO0001 Reserved for inherently not codable concepts without codable children: Secondary | ICD-10-CM | POA: Diagnosis not present

## 2013-07-24 DIAGNOSIS — Z96649 Presence of unspecified artificial hip joint: Secondary | ICD-10-CM | POA: Diagnosis not present

## 2013-07-24 DIAGNOSIS — Z4800 Encounter for change or removal of nonsurgical wound dressing: Secondary | ICD-10-CM | POA: Diagnosis not present

## 2013-07-24 DIAGNOSIS — Z471 Aftercare following joint replacement surgery: Secondary | ICD-10-CM | POA: Diagnosis not present

## 2013-07-24 DIAGNOSIS — I10 Essential (primary) hypertension: Secondary | ICD-10-CM | POA: Diagnosis not present

## 2013-07-25 ENCOUNTER — Inpatient Hospital Stay (HOSPITAL_COMMUNITY)
Admission: EM | Admit: 2013-07-25 | Discharge: 2013-07-27 | DRG: 812 | Disposition: A | Discharge status: Home / Self Care | Payer: Medicare Other | Attending: Internal Medicine | Admitting: Internal Medicine

## 2013-07-25 ENCOUNTER — Encounter (HOSPITAL_COMMUNITY): Payer: Self-pay | Admitting: Emergency Medicine

## 2013-07-25 DIAGNOSIS — E871 Hypo-osmolality and hyponatremia: Secondary | ICD-10-CM

## 2013-07-25 DIAGNOSIS — I10 Essential (primary) hypertension: Secondary | ICD-10-CM | POA: Diagnosis present

## 2013-07-25 DIAGNOSIS — Z7982 Long term (current) use of aspirin: Secondary | ICD-10-CM

## 2013-07-25 DIAGNOSIS — R5381 Other malaise: Secondary | ICD-10-CM | POA: Diagnosis present

## 2013-07-25 DIAGNOSIS — R531 Weakness: Secondary | ICD-10-CM

## 2013-07-25 DIAGNOSIS — R404 Transient alteration of awareness: Secondary | ICD-10-CM | POA: Diagnosis not present

## 2013-07-25 DIAGNOSIS — M169 Osteoarthritis of hip, unspecified: Secondary | ICD-10-CM | POA: Diagnosis not present

## 2013-07-25 DIAGNOSIS — D649 Anemia, unspecified: Secondary | ICD-10-CM | POA: Diagnosis not present

## 2013-07-25 DIAGNOSIS — E785 Hyperlipidemia, unspecified: Secondary | ICD-10-CM | POA: Diagnosis present

## 2013-07-25 DIAGNOSIS — K219 Gastro-esophageal reflux disease without esophagitis: Secondary | ICD-10-CM | POA: Diagnosis present

## 2013-07-25 DIAGNOSIS — R5383 Other fatigue: Secondary | ICD-10-CM | POA: Diagnosis not present

## 2013-07-25 DIAGNOSIS — R42 Dizziness and giddiness: Secondary | ICD-10-CM | POA: Diagnosis not present

## 2013-07-25 DIAGNOSIS — K921 Melena: Secondary | ICD-10-CM | POA: Diagnosis not present

## 2013-07-25 DIAGNOSIS — D62 Acute posthemorrhagic anemia: Principal | ICD-10-CM | POA: Diagnosis present

## 2013-07-25 DIAGNOSIS — Z96649 Presence of unspecified artificial hip joint: Secondary | ICD-10-CM | POA: Diagnosis not present

## 2013-07-25 DIAGNOSIS — R195 Other fecal abnormalities: Secondary | ICD-10-CM | POA: Diagnosis not present

## 2013-07-25 LAB — URINALYSIS, ROUTINE W REFLEX MICROSCOPIC
Bilirubin Urine: NEGATIVE
Glucose, UA: NEGATIVE mg/dL
Ketones, ur: NEGATIVE mg/dL
Nitrite: NEGATIVE
Specific Gravity, Urine: 1.018 (ref 1.005–1.030)
Urobilinogen, UA: 1 mg/dL (ref 0.0–1.0)

## 2013-07-25 LAB — BASIC METABOLIC PANEL
BUN: 16 mg/dL (ref 6–23)
CO2: 26 mEq/L (ref 19–32)
Chloride: 100 mEq/L (ref 96–112)
Creatinine, Ser: 0.56 mg/dL (ref 0.50–1.10)
Potassium: 3.8 mEq/L (ref 3.5–5.1)

## 2013-07-25 LAB — CBC
HCT: 27.1 % — ABNORMAL LOW (ref 36.0–46.0)
MCH: 30.9 pg (ref 26.0–34.0)
MCV: 94.1 fL (ref 78.0–100.0)
RBC: 2.88 MIL/uL — ABNORMAL LOW (ref 3.87–5.11)
WBC: 8.9 10*3/uL (ref 4.0–10.5)

## 2013-07-25 LAB — URINE MICROSCOPIC-ADD ON

## 2013-07-25 LAB — PROTIME-INR: Prothrombin Time: 15.7 seconds — ABNORMAL HIGH (ref 11.6–15.2)

## 2013-07-25 LAB — APTT: aPTT: 34 seconds (ref 24–37)

## 2013-07-25 LAB — OCCULT BLOOD, POC DEVICE: Fecal Occult Bld: NEGATIVE

## 2013-07-25 MED ORDER — SODIUM CHLORIDE 0.9 % IV BOLUS (SEPSIS)
500.0000 mL | Freq: Once | INTRAVENOUS | Status: AC
  Administered 2013-07-25: 500 mL via INTRAVENOUS

## 2013-07-25 MED ORDER — ALUM & MAG HYDROXIDE-SIMETH 200-200-20 MG/5ML PO SUSP: 30.0000 mL | Freq: Four times a day (QID) | ORAL | Status: DC | PRN

## 2013-07-25 MED ORDER — TRAMADOL HCL 50 MG PO TABS: 50.0000 mg | ORAL_TABLET | Freq: Four times a day (QID) | ORAL | Status: DC | PRN

## 2013-07-25 MED ORDER — SIMVASTATIN 20 MG PO TABS
20.0000 mg | ORAL_TABLET | Freq: Every evening | ORAL | Status: DC
  Administered 2013-07-25 – 2013-07-26 (×2): 20 mg via ORAL
  Filled 2013-07-25 (×3): qty 1

## 2013-07-25 MED ORDER — SODIUM CHLORIDE 0.9 % IV SOLN
INTRAVENOUS | Status: DC
  Administered 2013-07-25: 21:00:00 via INTRAVENOUS

## 2013-07-25 MED ORDER — METOPROLOL TARTRATE 25 MG PO TABS
25.0000 mg | ORAL_TABLET | Freq: Two times a day (BID) | ORAL | Status: DC
  Administered 2013-07-25 – 2013-07-27 (×4): 25 mg via ORAL
  Filled 2013-07-25 (×5): qty 1

## 2013-07-25 MED ORDER — PANTOPRAZOLE SODIUM 40 MG IV SOLR
40.0000 mg | Freq: Two times a day (BID) | INTRAVENOUS | Status: DC
  Administered 2013-07-25: 40 mg via INTRAVENOUS
  Filled 2013-07-25 (×3): qty 40

## 2013-07-25 MED ORDER — ONDANSETRON HCL 4 MG/2ML IJ SOLN: 4.0000 mg | Freq: Four times a day (QID) | INTRAMUSCULAR | Status: DC | PRN

## 2013-07-25 MED ORDER — SODIUM CHLORIDE 0.9 % IJ SOLN
3.0000 mL | Freq: Two times a day (BID) | INTRAMUSCULAR | Status: DC
  Administered 2013-07-25 – 2013-07-26 (×3): 3 mL via INTRAVENOUS

## 2013-07-25 MED ORDER — METOPROLOL TARTRATE 12.5 MG HALF TABLET: 12.5000 mg | ORAL_TABLET | Freq: Two times a day (BID) | ORAL | Status: DC

## 2013-07-25 MED ORDER — ONDANSETRON HCL 4 MG PO TABS: 4.0000 mg | ORAL_TABLET | Freq: Four times a day (QID) | ORAL | Status: DC | PRN

## 2013-07-25 MED ORDER — ACETAMINOPHEN 500 MG PO TABS
1000.0000 mg | ORAL_TABLET | Freq: Three times a day (TID) | ORAL | Status: DC | PRN
  Administered 2013-07-25 – 2013-07-26 (×2): 1000 mg via ORAL
  Filled 2013-07-25 (×3): qty 2

## 2013-07-25 MED ORDER — BENAZEPRIL HCL 10 MG PO TABS: 10.0000 mg | ORAL_TABLET | Freq: Every morning | ORAL | Status: DC

## 2013-07-25 NOTE — ED Provider Notes (Signed)
CSN: [id number]     Arrival date & time 07/25/13  1643 History   First MD Initiated Contact with Patient 07/25/13 1648     Chief Complaint  Patient presents with  . GI Bleeding   (Consider location/radiation/quality/duration/timing/severity/associated sxs/prior Treatment) HPI Comments: 79 year old female presents after having a large, black and tarry stool. She's been having black stools the past 2 days after being placed on iron status post a hip surgery. She's known to be anemic but has not required transfusion after surgery. She's currently on a 14 day course as a relative to prevent clots from her hip surgery. She states that right after the bowel movement today she felt weak and more fatigue. She's been having normal formed black stools but this is different. Has not had a stool since then. Denies abdominal pain or chest pain.   Past Medical History  Diagnosis Date  . Complication of anesthesia     SLOW TO WAKE UP   . Hypertension     Can get hypotensive at times per patient  . Palpitations   . Arthritis   . Rash     rt groin  . Frequent bowel movements   . GERD (gastroesophageal reflux disease)     10 lb wt loss past 3 yrs  . Anemia    Past Surgical History  Procedure Laterality Date  . Appendectomy    . Tonsillectomy    . Gynecologic cryosurgery    . Total hip arthroplasty Right 07/17/2013    Procedure: RIGHT TOTAL HIP ARTHROPLASTY ANTERIOR APPROACH;  Surgeon: Loanne Drilling, MD;  Location: [facility];  Service: Orthopedics;  Laterality: Right;   History reviewed. No pertinent family history. History  Substance Use Topics  . Smoking status: Never Smoker   . Smokeless tobacco: Not on file  . Alcohol Use: Yes     Comment: 1 glass wine daily - stopped for surgery   OB History   Grav Para Term Preterm Abortions TAB SAB Ect Mult Living                 Review of Systems  Constitutional: Positive for fatigue.  Respiratory: Negative for shortness of breath.    Cardiovascular: Negative for chest pain.  Gastrointestinal: Negative for nausea, vomiting and abdominal pain.  Neurological: Positive for weakness. Negative for light-headedness.  All other systems reviewed and are negative.    Allergies  Codeine and Tramadol  Home Medications   Current Outpatient Rx  Name  Route  Sig  Dispense  Refill  . acetaminophen (TYLENOL) 500 MG tablet   Oral   Take 1,000 mg by mouth every 6 (six) hours as needed for pain.          . benazepril (LOTENSIN) 10 MG tablet   Oral   Take 10 mg by mouth every morning.         . cholecalciferol (VITAMIN D) 1000 UNITS tablet   Oral   Take 1,000 Units by mouth daily.         Marland Kitchen doxylamine, Sleep, (UNISOM) 25 MG tablet   Oral   Take 25 mg by mouth at bedtime as needed for sleep.         . ferrous sulfate 325 (65 FE) MG tablet   Oral   Take 325 mg by mouth daily with breakfast.         . methocarbamol (ROBAXIN) 500 MG tablet   Oral   Take 1 tablet (500 mg total) by mouth every 6 (  six) hours as needed.   80 tablet   0   . metoprolol tartrate (LOPRESSOR) 25 MG tablet   Oral   Take 12.5 mg by mouth 2 (two) times daily.         . Probiotic Product (PHILLIPS COLON HEALTH PO)   Oral   Take 1 tablet by mouth daily.         . rivaroxaban (XARELTO) 10 MG TABS tablet   Oral   Take 1 tablet (10 mg total) by mouth daily with breakfast. Take Xarelto for two and a half more weeks, then discontinue Xarelto. Once the patient has completed the Xarelto, they may resume the 81 mg Aspirin.   19 tablet   0   . simvastatin (ZOCOR) 40 MG tablet   Oral   Take 20 mg by mouth every evening.         Marland Kitchen aspirin 81 MG chewable tablet   Oral   Chew 81 mg by mouth daily.          BP 146/61  Pulse 98  Temp(Src) 97.7 F (36.5 C) (Oral)  Resp 16  SpO2 99% Physical Exam  Nursing note and vitals reviewed. Constitutional: She is oriented to person, place, and time. She appears well-developed and  well-nourished.  HENT:  Head: Normocephalic and atraumatic.  Right Ear: External ear normal.  Left Ear: External ear normal.  Nose: Nose normal.  Eyes: Right eye exhibits no discharge. Left eye exhibits no discharge.  Cardiovascular: Normal rate, regular rhythm and normal heart sounds.   Pulmonary/Chest: Effort normal and breath sounds normal.  Abdominal: Soft. She exhibits no distension. There is no tenderness.  Genitourinary: Rectal exam shows no tenderness.  No stool in rectum  Neurological: She is alert and oriented to person, place, and time.  Skin: Skin is warm and dry.    ED Course  Procedures (including critical care time) Labs Review Labs Reviewed  CBC - Abnormal; Notable for the following:    RBC 2.88 (*)    Hemoglobin 8.9 (*)    HCT 27.1 (*)    Platelets 462 (*)    All other components within normal limits  BASIC METABOLIC PANEL - Abnormal; Notable for the following:    GFR calc non Af Amer 86 (*)    All other components within normal limits  PROTIME-INR - Abnormal; Notable for the following:    Prothrombin Time 15.7 (*)    All other components within normal limits  APTT  OCCULT BLOOD, POC DEVICE  TYPE AND SCREEN   Imaging Review No results found.  EKG Interpretation   None       MDM   1. Anemia    Patient with likely GI bleed given her description of the stool. There is no stool on my rectal exam, and thus with a high clinical suspicion with a negative occult stool test I feel she should be observed in hospital with serial hemoglobins.    Audree Camel, MD 07/25/13 [phone]

## 2013-07-25 NOTE — ED Notes (Signed)
MD Goldston at bedside  

## 2013-07-25 NOTE — ED Notes (Signed)
Bed: WA01 Expected date:  Expected time:  Means of arrival:  Comments: EMS-GI bleed

## 2013-07-25 NOTE — ED Notes (Signed)
:  PPt to ED with c/o dark tarry diarrhea this morning.  Pt reports weakness with diarrhea.  Pt was seen at MD office today.  Pt hgb 9.3.  Denies n/v or abd pain.

## 2013-07-25 NOTE — ED Notes (Signed)
Called to give report nurse unavailable will call back.  

## 2013-07-25 NOTE — H&P (Signed)
[facility] History and Physical  Lori [name] ZOX:[id number] DOB: 1932/07/09 DOA: 07/25/2013  Referring physician:  PCP:  Duane Lope, MD  Specialists:   Chief Complaint: Black tarry stools  HPI: Lori [name] is a 79 y.o. female with a past medical history of  osteoarthritis who underwent right total hip arthroplasty, procedure performed by Dr.Aluisio of orthopedic surgery on 07/17/2013, discharged on 10 mg of Xarelto daily for DVT prophylaxis. He has also been on iron therapy. She presents to the emergency room today with complaints of having 2 episodes of black tarry stools, first episode occurred on Tuesday. Second episode occurred this morning, reporting having black, "muddy" bowel movement, after which she felt dizzy and lightheaded. She denies history of GI bleed. She is currently on aspirin 81 mg by mouth daily along with Xarelto 10 mg by mouth daily. She does not take any other NSAIDS. Lab work in the emergency department showed a hemoglobin of 8.9. Prior to that it was 9.1 on 07/19/2013. She was guaiac negative. Patient otherwise hemodynamically stable. She denies abdominal pain, shortness of breath, chest pain, nausea, vomiting, hematemesis or bright red blood per rectum.                                                Review of Systems: The patient denies anorexia, fever, weight loss,, vision loss, decreased hearing, hoarseness, chest pain, syncope, dyspnea on exertion, peripheral edema, balance deficits, hemoptysis, abdominal pain, hematochezia, severe indigestion/heartburn, hematuria, incontinence, genital sores, muscle weakness, suspicious skin lesions, transient blindness, difficulty walking, depression, unusual weight change, abnormal bleeding, enlarged lymph nodes, angioedema, and breast masses.    Past Medical History  Diagnosis Date  . Complication of anesthesia     SLOW TO WAKE UP   . Hypertension     Can get hypotensive at times per patient  . Palpitations   .  Arthritis   . Rash     rt groin  . Frequent bowel movements   . GERD (gastroesophageal reflux disease)     10 lb wt loss past 3 yrs  . Anemia    Past Surgical History  Procedure Laterality Date  . Appendectomy    . Tonsillectomy    . Gynecologic cryosurgery    . Total hip arthroplasty Right 07/17/2013    Procedure: RIGHT TOTAL HIP ARTHROPLASTY ANTERIOR APPROACH;  Surgeon: Loanne Drilling, MD;  Location: [facility];  Service: Orthopedics;  Laterality: Right;   Social History:  reports that she has never smoked. She does not have any smokeless tobacco history on file. She reports that she drinks alcohol. She reports that she does not use illicit drugs. At baseline patient does not have cognitive impairment, she is able to perform all activities of daily living independently.  Allergies  Allergen Reactions  . Codeine Nausea And Vomiting  . Tramadol Other (See Comments)    syncope    History reviewed. No pertinent family history. noncontributory  Prior to Admission medications   Medication Sig Start Date End Date Taking? Authorizing Provider  acetaminophen (TYLENOL) 500 MG tablet Take 1,000 mg by mouth every 6 (six) hours as needed for pain.    Yes Historical Provider, MD  benazepril (LOTENSIN) 10 MG tablet Take 10 mg by mouth every morning.   Yes Historical Provider, MD  cholecalciferol (VITAMIN D) 1000 UNITS tablet Take 1,000 Units by  mouth daily.   Yes Historical Provider, MD  doxylamine, Sleep, (UNISOM) 25 MG tablet Take 25 mg by mouth at bedtime as needed for sleep.   Yes Historical Provider, MD  ferrous sulfate 325 (65 FE) MG tablet Take 325 mg by mouth daily with breakfast.   Yes Historical Provider, MD  methocarbamol (ROBAXIN) 500 MG tablet Take 1 tablet (500 mg total) by mouth every 6 (six) hours as needed. 07/19/13  Yes Alexzandrew Julien Girt, PA-C  metoprolol tartrate (LOPRESSOR) 25 MG tablet Take 12.5 mg by mouth 2 (two) times daily.   Yes Historical Provider, MD  Probiotic  Product (PHILLIPS COLON HEALTH PO) Take 1 tablet by mouth daily.   Yes Historical Provider, MD  rivaroxaban (XARELTO) 10 MG TABS tablet Take 1 tablet (10 mg total) by mouth daily with breakfast. Take Xarelto for two and a half more weeks, then discontinue Xarelto. Once the patient has completed the Xarelto, they may resume the 81 mg Aspirin. 07/19/13  Yes Alexzandrew Julien Girt, PA-C  simvastatin (ZOCOR) 40 MG tablet Take 20 mg by mouth every evening.   Yes Historical Provider, MD  aspirin 81 MG chewable tablet Chew 81 mg by mouth daily.    Historical Provider, MD   Physical Exam: Filed Vitals:   07/25/13 1952  BP: 125/60  Pulse: 95  Temp: 97.9 F (36.6 C)  Resp: 18     General:  She is in no acute distress, awake alert oriented, pleasant cooperative  Eyes: Pupils are equal round reactive to light extraocular movement is intact  Neck: Supple symmetrical no jugular venous distention or carotid bruits  Cardiovascular: Regular rate rhythm normal S1-S2 no murmurs rubs gallops  Respiratory: Lungs are clear to auscultation bilaterally no wheezing rhonchi or rales  Abdomen: Abdomen is benign, no rebound tenderness or guarding, positive bowel sounds in upper quadrant  Skin: No rashes or lesions noted  Musculoskeletal: Surgical incision site of right hip appear to be healing well, no hematoma or evidence of infection  Psychiatric: Patient is awake alert and oriented x3  Neurologic: Cranial nerves II through XII are grossly intact no alteration to sensation global 5 of 5 muscle  Labs on Admission:  Basic Metabolic Panel:  Recent Labs Lab 07/19/13 0433 07/25/13 1714  NA 139 136  K 3.9 3.8  CL 102 100  CO2 29 26  GLUCOSE 140* 94  BUN 10 16  CREATININE 0.53 0.56  CALCIUM 9.5 9.5   Liver Function Tests: No results found for this basename: AST, ALT, ALKPHOS, BILITOT, PROT, ALBUMIN,  in the last 168 hours No results found for this basename: LIPASE, AMYLASE,  in the last 168  hours No results found for this basename: AMMONIA,  in the last 168 hours CBC:  Recent Labs Lab 07/19/13 0433 07/25/13 1714  WBC 15.5* 8.9  HGB 9.1* 8.9*  HCT 26.5* 27.1*  MCV 91.7 94.1  PLT 210 462*   Cardiac Enzymes: No results found for this basename: CKTOTAL, CKMB, CKMBINDEX, TROPONINI,  in the last 168 hours  BNP (last 3 results) No results found for this basename: PROBNP,  in the last 8760 hours CBG: No results found for this basename: GLUCAP,  in the last 168 hours  Radiological Exams on Admission: No results found.    Assessment/Plan Active Problems:   Melena   OA (osteoarthritis) of hip   Postoperative anemia due to acute blood loss   Weakness   Dizziness   1. Melena. Patient presently on Xarelto and aspirin therapy, presenting with  complaints of 2 episodes of melanotic stools this week, reporting associated dizziness lightheadedness as well as generalized weakness. She is hemodynamically stable, with initial lab work showing a hemoglobin of 8.9. She previously had a hemoglobin of 9.1 on 07/19/2013. Will discontinue aspirin and Xarelto, start PPI therapy with Protonix 40 mg IV twice a day. She has already been typed and screened, repeat CBC in a.m. Provide IV fluid hydration overnight, consult gastroenterology in the morning. 2. Hypertension. Blood pressures are stable at 125/60. We'll continue metoprolol therapy, hold benazepril for now as I am concerned with precipitating hypotension given that she presents with a possible GI bleed.  3. Dyslipidemia. Continue statin therapy 4. Gastroesophageal reflux disease. Patient to be started on PPI therapy 5. Nutrition. Will place patient on clears 6. DVT prophylaxis. Bilateral extremity SCDs   Code Status: Full code Family Communication: Plan discussed with daughter present at bedside Disposition Plan: I anticipate patient may require greater than 2 night hospitalization  Time spent: 89 miutes  Jeralyn Bennett [facility] Pager [phone]  If 7PM-7AM, please contact night-coverage [email or web address] Password [email or web address] 07/25/2013, 8:31 PM

## 2013-07-25 NOTE — Progress Notes (Signed)
Called ED nurse to get report, on hold for 5 minutes.

## 2013-07-26 ENCOUNTER — Encounter (HOSPITAL_COMMUNITY)
Admission: EM | Disposition: A | Discharge status: Home / Self Care | Payer: Self-pay | Source: Home / Self Care | Attending: Internal Medicine

## 2013-07-26 ENCOUNTER — Encounter (HOSPITAL_COMMUNITY): Payer: Self-pay

## 2013-07-26 DIAGNOSIS — K921 Melena: Secondary | ICD-10-CM

## 2013-07-26 DIAGNOSIS — D62 Acute posthemorrhagic anemia: Secondary | ICD-10-CM | POA: Diagnosis not present

## 2013-07-26 DIAGNOSIS — M169 Osteoarthritis of hip, unspecified: Secondary | ICD-10-CM | POA: Diagnosis not present

## 2013-07-26 DIAGNOSIS — R42 Dizziness and giddiness: Secondary | ICD-10-CM | POA: Diagnosis not present

## 2013-07-26 DIAGNOSIS — D649 Anemia, unspecified: Secondary | ICD-10-CM | POA: Diagnosis not present

## 2013-07-26 HISTORY — PX: ESOPHAGOGASTRODUODENOSCOPY: SHX5428

## 2013-07-26 LAB — VITAMIN B12: Vitamin B-12: 502 pg/mL (ref 211–911)

## 2013-07-26 LAB — BASIC METABOLIC PANEL
BUN: 12 mg/dL (ref 6–23)
GFR calc Af Amer: >90 mL/min (ref >90)
GFR calc non Af Amer: 87 mL/min — ABNORMAL LOW (ref >90)
Glucose, Bld: 102 mg/dL — ABNORMAL HIGH (ref 70–99)
Potassium: 3.9 mEq/L (ref 3.5–5.1)
Sodium: 137 mEq/L (ref 135–145)

## 2013-07-26 LAB — IRON AND TIBC
Iron: 37 ug/dL — ABNORMAL LOW (ref 42–135)
Saturation Ratios: 15 % — ABNORMAL LOW (ref 20–55)

## 2013-07-26 LAB — HEMOGLOBIN AND HEMATOCRIT, BLOOD
HCT: 24.6 % — ABNORMAL LOW (ref 36.0–46.0)
HCT: 25.5 % — ABNORMAL LOW (ref 36.0–46.0)
Hemoglobin: 8.2 g/dL — ABNORMAL LOW (ref 12.0–15.0)

## 2013-07-26 LAB — CBC
Hemoglobin: 8 g/dL — ABNORMAL LOW (ref 12.0–15.0)
MCHC: 33.5 g/dL (ref 30.0–36.0)
RBC: 2.55 MIL/uL — ABNORMAL LOW (ref 3.87–5.11)
WBC: 8.6 10*3/uL (ref 4.0–10.5)

## 2013-07-26 LAB — TYPE AND SCREEN
ABO/RH(D): A POS
Antibody Screen: NEGATIVE

## 2013-07-26 LAB — TSH: TSH: 0.829 u[IU]/mL (ref 0.350–4.500)

## 2013-07-26 SURGERY — EGD (ESOPHAGOGASTRODUODENOSCOPY)
Anesthesia: Moderate Sedation

## 2013-07-26 MED ORDER — BUTAMBEN-TETRACAINE-BENZOCAINE 2-2-14 % EX AERO
INHALATION_SPRAY | CUTANEOUS | Status: DC | PRN
  Administered 2013-07-26: 1 via TOPICAL

## 2013-07-26 MED ORDER — FENTANYL CITRATE 0.05 MG/ML IJ SOLN
INTRAMUSCULAR | Status: AC
  Filled 2013-07-26: qty 2

## 2013-07-26 MED ORDER — SODIUM CHLORIDE 0.9 % IV SOLN: INTRAVENOUS | Status: DC

## 2013-07-26 MED ORDER — LACTATED RINGERS IV SOLN
INTRAVENOUS | Status: DC
  Administered 2013-07-26: 1000 mL via INTRAVENOUS

## 2013-07-26 MED ORDER — MIDAZOLAM HCL 10 MG/2ML IJ SOLN
INTRAMUSCULAR | Status: DC | PRN
  Administered 2013-07-26 (×2): 2 mg via INTRAVENOUS

## 2013-07-26 MED ORDER — PANTOPRAZOLE SODIUM 40 MG PO TBEC
40.0000 mg | DELAYED_RELEASE_TABLET | Freq: Every day | ORAL | Status: DC
  Administered 2013-07-26 – 2013-07-27 (×2): 40 mg via ORAL
  Filled 2013-07-26 (×2): qty 1

## 2013-07-26 MED ORDER — RIVAROXABAN 10 MG PO TABS
10.0000 mg | ORAL_TABLET | Freq: Every day | ORAL | Status: DC
  Administered 2013-07-26 – 2013-07-27 (×2): 10 mg via ORAL
  Filled 2013-07-26 (×4): qty 1

## 2013-07-26 MED ORDER — METOPROLOL TARTRATE 12.5 MG HALF TABLET: 12.5000 mg | ORAL_TABLET | Freq: Two times a day (BID) | ORAL | Status: DC

## 2013-07-26 MED ORDER — PANTOPRAZOLE SODIUM 40 MG IV SOLR: 40.0000 mg | Freq: Two times a day (BID) | INTRAVENOUS | Status: DC

## 2013-07-26 MED ORDER — MIDAZOLAM HCL 10 MG/2ML IJ SOLN
INTRAMUSCULAR | Status: AC
  Filled 2013-07-26: qty 2

## 2013-07-26 NOTE — Progress Notes (Signed)
Report given to gretchen on the 4th floor -pt will be transported  to 1430.

## 2013-07-26 NOTE — Progress Notes (Signed)
Patient admitted from the ED to 1430, alert and oriented, C/O slight dizzines  with activity (walking) which have been going on the past 2day at home. MD aware. Oriented patient/daughter to room/unit/hospital. Reviewed plan of care with patient/daughter, will continue to monitor patient. No sink issue noted but surgical incision on right hip with no sign of infection noted, covered with steri-strip.  Will continue to F/U with plan of care.

## 2013-07-26 NOTE — Consult Note (Signed)
Referring Provider: P. Thedore Mins ([facility]) Primary Care Physician:   Duane Lope, MD Primary Gastroenterologist:  Dr. Vida Rigger  Reason for Consultation:  Melena, anemia  HPI: Lori [name] is a 79 y.o. female admitted through the emergency room yesterday after an explosive, dark bowel movement. She was Hemoccult negative in the emergency room but apparently there was essentially no stool to guaiac so the reliability of that is uncertain. She had felt very weak around the time of that bowel movement and was orthostatic ring around when she was seen in her primary physician's office yesterday prior to coming to the emergency room.  The patient is 9 days status post a right-sided total hip replacement, having last taken aspirin about 2 weeks ago and having been started on Xarelto since the time of that surgery, for DVT prophylaxis. She has no prior history of ulcer disease or GI bleeding. She has not had dyspeptic symptomatology.  She was on iron, and had been having formed, dark bowel movements following her surgery until the explosive bowel movement yesterday morning. The patient has not been on PPI prophylaxis.  On discharge from the hospital about a week ago, the patient's hemoglobin was 9.1; yesterday, in her primary physician's office, it was 9.3. In the emergency room, it was 8.9, and this morning, it is 8.0. BUN was normal on admission, at 16.  The patient had a negative surveillance colonoscopy by Dr. Danise Edge in November, 2010.   Past Medical History  Diagnosis Date  . Complication of anesthesia     SLOW TO WAKE UP   . Hypertension     Can get hypotensive at times per patient  . Palpitations   . Arthritis   . Rash     rt groin  . Frequent bowel movements   . GERD (gastroesophageal reflux disease)     10 lb wt loss past 3 yrs  . Anemia     Past Surgical History  Procedure Laterality Date  . Appendectomy    . Tonsillectomy    . Gynecologic cryosurgery    .  Total hip arthroplasty Right 07/17/2013    Procedure: RIGHT TOTAL HIP ARTHROPLASTY ANTERIOR APPROACH;  Surgeon: Loanne Drilling, MD;  Location: [facility];  Service: Orthopedics;  Laterality: Right;    Prior to Admission medications   Medication Sig Start Date End Date Taking? Authorizing Provider  acetaminophen (TYLENOL) 500 MG tablet Take 1,000 mg by mouth every 6 (six) hours as needed for pain.    Yes Historical Provider, MD  benazepril (LOTENSIN) 10 MG tablet Take 10 mg by mouth every morning.   Yes Historical Provider, MD  cholecalciferol (VITAMIN D) 1000 UNITS tablet Take 1,000 Units by mouth daily.   Yes Historical Provider, MD  doxylamine, Sleep, (UNISOM) 25 MG tablet Take 25 mg by mouth at bedtime as needed for sleep.   Yes Historical Provider, MD  ferrous sulfate 325 (65 FE) MG tablet Take 325 mg by mouth daily with breakfast.   Yes Historical Provider, MD  methocarbamol (ROBAXIN) 500 MG tablet Take 1 tablet (500 mg total) by mouth every 6 (six) hours as needed. 07/19/13  Yes Alexzandrew Julien Girt, PA-C  metoprolol tartrate (LOPRESSOR) 25 MG tablet Take 12.5 mg by mouth 2 (two) times daily.   Yes Historical Provider, MD  Probiotic Product (PHILLIPS COLON HEALTH PO) Take 1 tablet by mouth daily.   Yes Historical Provider, MD  rivaroxaban (XARELTO) 10 MG TABS tablet Take 1 tablet (10 mg total) by mouth  daily with breakfast. Take Xarelto for two and a half more weeks, then discontinue Xarelto. Once the patient has completed the Xarelto, they may resume the 81 mg Aspirin. 07/19/13  Yes Alexzandrew Julien Girt, PA-C  simvastatin (ZOCOR) 40 MG tablet Take 20 mg by mouth every evening.   Yes Historical Provider, MD  aspirin 81 MG chewable tablet Chew 81 mg by mouth daily.    Historical Provider, MD    Current Facility-Administered Medications  Medication Dose Route Frequency Provider Last Rate Last Dose  . acetaminophen (TYLENOL) tablet 1,000 mg  1,000 mg Oral Q8H PRN Jeralyn Bennett, MD   1,000 mg at  07/25/13 2340  . alum & mag hydroxide-simeth (MAALOX/MYLANTA) 200-200-20 MG/5ML suspension 30 mL  30 mL Oral Q6H PRN Jeralyn Bennett, MD      . metoprolol tartrate (LOPRESSOR) tablet 25 mg  25 mg Oral BID Jeralyn Bennett, MD   25 mg at 07/25/13 2340  . ondansetron (ZOFRAN) tablet 4 mg  4 mg Oral Q6H PRN Jeralyn Bennett, MD       Or  . ondansetron (ZOFRAN) injection 4 mg  4 mg Intravenous Q6H PRN Jeralyn Bennett, MD      . pantoprazole (PROTONIX) injection 40 mg  40 mg Intravenous Q12H Jeralyn Bennett, MD   40 mg at 07/25/13 2344  . simvastatin (ZOCOR) tablet 20 mg  20 mg Oral QPM Jeralyn Bennett, MD   20 mg at 07/25/13 2156  . sodium chloride 0.9 % injection 3 mL  3 mL Intravenous Q12H Jeralyn Bennett, MD   3 mL at 07/25/13 2157  . traMADol (ULTRAM) tablet 50 mg  50 mg Oral Q6H PRN Jeralyn Bennett, MD        Allergies as of 07/25/2013 - Review Complete 07/25/2013  Allergen Reaction Noted  . Codeine Nausea And Vomiting 07/08/2013  . Tramadol Other (See Comments) 07/25/2013    History reviewed. No pertinent family history.  History   Social History  . Marital Status: Married    Spouse Name: N/A    Number of Children: N/A  . Years of Education: N/A   Occupational History  . Not on file.   Social History Main Topics  . Smoking status: Never Smoker   . Smokeless tobacco: Not on file  . Alcohol Use: Yes     Comment: 1 glass wine daily - stopped for surgery  . Drug Use: No  . Sexual Activity: Not on file   Other Topics Concern  . Not on file   Social History Narrative  . No narrative on file    Review of Systems: See history of present illness  Physical Exam: Vital signs in last 24 hours: Temp:  [97.7 F (36.5 C)-98.7 F (37.1 C)] 98.7 F (37.1 C) (10/31 0500) Pulse Rate:  [80-103] 89 (10/31 0500) Resp:  [16-18] 17 (10/31 0500) BP: (99-150)/(49-80) 123/68 mmHg (10/31 0500) SpO2:  [98 %-100 %] 100 % (10/31 0500) Weight:  [53.7 kg (118 lb 6.2 oz)] 53.7 kg (118 lb 6.2  oz) (10/30 2050) Last BM Date: 07/25/13 General:   Alert,  Well-developed, well-nourished, pleasant and cooperative in absolutely NAD, sitting in a recliner. There is no evident pallor. She is completely coherent. For cardiac, pulmonary, and abdominal exams, please see preprocedural assessment.  Intake/Output from previous day: 10/30 0701 - 10/31 0700 In: 758.8 [I.V.:758.8] Out: 450 [Urine:450] Intake/Output this shift: Total I/O In: 191.3 [I.V.:191.3] Out: 600 [Urine:600]  Lab Results:  Recent Labs  07/25/13 1714 07/26/13 0500  WBC 8.9  8.6  HGB 8.9* 8.0*  HCT 27.1* 23.9*  PLT 462* 404*   BMET  Recent Labs  07/25/13 1714 07/26/13 0500  NA 136 137  K 3.8 3.9  CL 100 105  CO2 26 24  GLUCOSE 94 102*  BUN 16 12  CREATININE 0.56 0.54  CALCIUM 9.5 9.1   LFT No results found for this basename: PROT, ALBUMIN, AST, ALT, ALKPHOS, BILITOT, BILIDIR, IBILI,  in the last 72 hours PT/INR  Recent Labs  07/25/13 1714  LABPROT 15.7*  INR 1.28      Studies/Results: No results found.  Impression: 1. Dark stools and weakness in a patient previously on aspirin and recently on Xarelto 2. Anemia, with slight drop in hemoglobin from postoperative baseline a week ago   Plan: It is unclear whether this patient has truly had a bleeding episode, or not. She is at risk for bleeding because of her prior aspirin exposure in her recent anticoagulation. She had dark stools, and she felt weak and dizzy. There has been a 1 g drop in hemoglobin from baseline. On the other hand, her stool is Hemoccult negative, her BUN was normal, the dark stools might be explained by her iron exposure, and the drop in hemoglobin could be partially due to dilution.  I went over the nature, purpose, risks, and alternatives of endoscopic evaluation at this time with the patient and her daughter, who is at the bedside. We considered the option of observation, versus endoscopic evaluation. We all feel that  endoscopic evaluation is appropriate, so it will be arranged for later this morning.   LOS: 1 day   Tamia Dial V  07/26/2013, 9:31 AM

## 2013-07-26 NOTE — Progress Notes (Signed)
Pt is active with [facility] and will continue at discharge. Will need Home Health Orders Resumed. Thanks

## 2013-07-26 NOTE — Op Note (Signed)
[facility] [address] [place], [address]   ENDOSCOPY PROCEDURE REPORT  PATIENT: Lori, [name]  MR#: [id number] BIRTHDATE: 01/14/1932 , 81  yrs. old GENDER: Female ENDOSCOPIST:Arrington Yohe, MD REFERRED BY:  Dr. Vianne Bulls, Dr. Vida Rigger PROCEDURE DATE:  07/26/2013 PROCEDURE:     upper endoscopy ASA CLASS: INDICATIONS:   melenic stool, drop in hemoglobin, Hemoccult negative in the ER MEDICATION:    Versed 4 mg IV TOPICAL ANESTHETIC:    Cetacaine spray  DESCRIPTION OF PROCEDURE:   the patient was brought from her hospital room to the [facility] long endoscopy unit after providing written consent. Time out was performed. The Pentax adult video endoscope was passed under direct vision. The vocal cords were not well seen, but the esophagus was readily entered.  The esophagus was normal except for a widely patent, minimal esophageal mucosal ring (Schatzki's ring) the low which was a 1 cm hiatal hernia. No Mallory-Weiss tear, varices, reflux esophagitis, infection, Barrett's mucosa, or masses were seen.  The stomach was entered. It contained no blood or coffee-ground material. The mucosa the stomach was entirely normal, specifically without evidence of any gastritis, erosions, ulcers, polyps, or masses. A retroflexed view of the cardia was normal.  The pylorus, duodenal bulb, and second duodenum looked normal.  Specifically, there was no evidence of any aspirin-related gastropathy nor any vascular malformations.  The scope was removed from the patient. She remained stable throughout the procedure. No biopsies were obtained.     COMPLICATIONS: None  ENDOSCOPIC IMPRESSION:  1. No bleeding or blood in the stomach at the time this exam. 2. No source of GI bleeding endoscopically evident in the upper tract 3. Minimal hiatal hernia and minimal Schatzki's ring  RECOMMENDATIONS:  it remains unclear whether this patient had a GI bleed or not. She had  a large, liquid, dark bowel movement yesterday (on iron) and has felt weak and dizzy the past couple of days, and there has been a mild 1 gm drop in hgb, but her stool was Hemoccult negative in the ER (but only minimal sample available, per conversation with the patient's daughter) and her BUN was normal at time of presentation.  In any event, it appears that there is no source of bleeding in the upper tract, so I would favor resumption of anticoagulation, advancement of diet, and monitoring the patient for evidence of further bleeding, including Hemoccult all stools. I have discussed this with the attending hospitalist physician.    _______________________________ eSigned:  Bernette Redbird, MD 07/26/2013 11:20 AM    PATIENT NAME:  Lori, [name] MR#: [id number]

## 2013-07-26 NOTE — Progress Notes (Signed)
ANTICOAGULATION CONSULT NOTE - Initial Consult  Pharmacy Consult for Xarelto Indication: VTE prophylaxis  Allergies  Allergen Reactions  . Codeine Nausea And Vomiting  . Tramadol Other (See Comments)    syncope    Patient Measurements: Height: 5\' 3"  (160 cm) Weight: 118 lb 6.2 oz (53.7 kg) IBW/kg (Calculated) : 52.4 Heparin Dosing Weight:   Vital Signs: Temp: 98.3 F (36.8 C) (10/31 1113) Temp src: Oral (10/31 1113) BP: 103/45 mmHg (10/31 1125) Pulse Rate: 83 (10/31 1125)  Labs:  Recent Labs  07/25/13 1714 07/26/13 0500  HGB 8.9* 8.0*  HCT 27.1* 23.9*  PLT 462* 404*  APTT 34  --   LABPROT 15.7*  --   INR 1.28  --   CREATININE 0.56 0.54    Estimated Creatinine Clearance: 47.2 ml/min (by C-G formula based on Cr of 0.54).   Medical History: Past Medical History  Diagnosis Date  . Complication of anesthesia     SLOW TO WAKE UP   . Hypertension     Can get hypotensive at times per patient  . Palpitations   . Arthritis   . Rash     rt groin  . Frequent bowel movements   . GERD (gastroesophageal reflux disease)     10 lb wt loss past 3 yrs  . Anemia     Assessment: 59 YOF admitted with possible GIB on xarelto for VTE prophy s/p THA on 10/22. She felt dizzy and states her stools were dark.  Appears been mild decrease in Hgb based on previous values (9.1 --> 8). She was started on iron post-op, so may contribute to dark stools. She is s/p EGD 10/31 which was neg for bleeding and GI ok'd resumption of xarelto.   Goal of Therapy:  Monitor platelets by anticoagulation protocol: Yes   Plan:   Xarelto 10mg  PO daily, give first dose this afternoon (last dose was 10/30)  Monitor Hgb and for bleeding  Juliette Alcide, PharmD, BCPS.   Pager: [phone]  07/26/2013,11:27 AM

## 2013-07-26 NOTE — Progress Notes (Signed)
Pt ambulated around entire unit with steady gait using RW and contact guard assist. Pt w/ no dizziness or lightheadedness w/ ambulation, she tolerated the walk well.

## 2013-07-26 NOTE — Progress Notes (Signed)
Endoscopy normal; findings discussed with Dr. Thedore Mins and with the patient and family.  It is now somewhat less likely, but nonetheless possible, that the patient did indeed have a GI bleed.  Recommendations:  1. I will advance her diet 2. I will switch her to an oral PPI, once daily. I would continue this medication for prophylaxis purposes as long as she is on Xarelto. 3. I favor restarting anticoagulation in this patient and monitoring carefully for evidence of recurring bleeding. This will be done by a combination of blood work and stool Hemoccults. 4. If the patient shows evidence of bleeding by a drop in hemoglobin or heme positive stools, we will have to consider further GI tract evaluation, which could include a small bowel capsule endoscopy, CT enterography, and/or colonoscopy (negative colonoscopy 4 years ago by Dr. Laural Benes).  Lori [name], M.D. [phone]

## 2013-07-26 NOTE — Progress Notes (Signed)
[facility]                                                                                Patient Demographics  Lori [name], is a 79 y.o. female, DOB - 1932-06-03, ZOX:[id number]  Admit date - 07/25/2013   Admitting Physician Jeralyn Bennett, MD  Outpatient Primary MD for the patient is  Duane Lope, MD  LOS - 1   Chief Complaint  Patient presents with  . GI Bleeding        Assessment & Plan    Melena. Patient was on Xarelto and aspirin therapy, presenting with complaints of 2 episodes of melanotic stools this week, reported associated dizziness lightheadedness as well as generalized weakness. She is hemodynamically stable, with initial lab work showing a hemoglobin of 8.9. She previously had a hemoglobin of 9.1 on 07/19/2013. Will discontinue aspirin and Xarelto, started on Protonix 40 mg IV twice a day. She has already been typed and screened, repeat CBC shows a slight fall in her H&H also some element of dilution involved secondary to IV fluids, she clinically feels better and looks stable, GI has been consulted she is due for EGD later today. We will continue to monitor H&H and also check an anemia panel.    Hypertension. Blood pressures are stable at 125/60. We'll continue metoprolol therapy, hold benazepril for now.    Dyslipidemia. Continue statin therapy     Gastroesophageal reflux disease. Patient to be started on PPI therapy     Code Status: full  Family Communication: daughter  Disposition Plan: TBD   Procedures EGD   Consults  GI   Medications  Scheduled Meds: . metoprolol tartrate  25 mg Oral BID  . pantoprazole (PROTONIX) IV  40 mg Intravenous Q12H  . simvastatin  20 mg Oral QPM  . sodium chloride  3 mL Intravenous Q12H   Continuous Infusions: . sodium chloride    . lactated ringers 1,000 mL (07/26/13 0945)   PRN Meds:.acetaminophen, alum & mag hydroxide-simeth, butamben-tetracaine-benzocaine, midazolam, ondansetron (ZOFRAN) IV,  ondansetron, traMADol  DVT Prophylaxis    SCDs    Lab Results  Component Value Date   PLT 404* 07/26/2013    Antibiotics     Anti-infectives   None          Subjective:   Rexene Agent today has, No headache, No chest pain, No abdominal pain - No Nausea, No new weakness tingling or numbness, No Cough - SOB.    Objective:   Filed Vitals:   07/25/13 2340 07/26/13 0500 07/26/13 0934 07/26/13 1040  BP: 126/52 123/68 129/50 136/57  Pulse: 96 89  85  Temp:  98.7 F (37.1 C) 97.4 F (36.3 C)   TempSrc:  Oral    Resp:  17 17 15   Height:      Weight:      SpO2:  100% 85% 100%    Wt Readings from Last 3 Encounters:  07/25/13 53.7 kg (118 lb 6.2 oz)  07/25/13 53.7 kg (118 lb 6.2 oz)  07/17/13 55.509 kg (122 lb 6 oz)     Intake/Output Summary (Last 24 hours) at 07/26/13 1056 Last data filed at 07/26/13 8119  Gross  per 24 hour  Intake    950 ml  Output   1050 ml  Net   -100 ml    Exam Awake Alert, Oriented X 3, No new F.N deficits, Normal affect .AT,PERRAL Supple Neck,No JVD, No cervical lymphadenopathy appriciated.  Symmetrical Chest wall movement, Good air movement bilaterally, CTAB RRR,No Gallops,Rubs or new Murmurs, No Parasternal Heave +ve B.Sounds, Abd Soft, Non tender, No organomegaly appriciated, No rebound - guarding or rigidity. No Cyanosis, Clubbing or edema, No new Rash or bruise     Data Review   Micro Results No results found for this or any previous visit (from the past 240 hour(s)).  Radiology Reports Dg Chest 2 View  07/10/2013   CLINICAL DATA:  Hypertension.  EXAM: CHEST  2 VIEW  COMPARISON:  None.  FINDINGS: The heart size and mediastinal contours are within normal limits. Both lungs are clear. The visualized skeletal structures are unremarkable.  IMPRESSION: No active cardiopulmonary disease.   Electronically Signed   By: Roque Lias M.D.   On: 07/10/2013 12:18   Dg Hip Complete Right  07/10/2013   CLINICAL DATA:  Right hip pain.   EXAM: RIGHT HIP - COMPLETE 2+ VIEW  COMPARISON:  None.  FINDINGS: No fracture or dislocation is noted. Severe joint space narrowing and osteophyte formation is seen involving the right hip joint.  IMPRESSION: Severe degenerative joint disease of right hip.   Electronically Signed   By: Roque Lias M.D.   On: 07/10/2013 12:17   Dg Pelvis Portable  07/17/2013   CLINICAL DATA:  Postop right total hip  EXAM: PORTABLE PELVIS  COMPARISON:  None.  FINDINGS: Right total hip arthroplasty in satisfactory position.  Associated subcutaneous gas and surgical drain.  No fracture or dislocation is seen.  Visualized bony pelvis appears intact.  IMPRESSION: Right total hip arthroplasty in satisfactory position.   Electronically Signed   By: Charline Bills M.D.   On: 07/17/2013 15:20   Dg C-arm 1-60 Min-no Report  07/17/2013   CLINICAL DATA: Right total hip replacement   C-ARM 1-60 MINUTES  Fluoroscopy was utilized by the requesting physician.  No radiographic  interpretation.     CBC  Recent Labs Lab 07/25/13 1714 07/26/13 0500  WBC 8.9 8.6  HGB 8.9* 8.0*  HCT 27.1* 23.9*  PLT 462* 404*  MCV 94.1 93.7  MCH 30.9 31.4  MCHC 32.8 33.5  RDW 12.7 12.8    Chemistries   Recent Labs Lab 07/25/13 1714 07/26/13 0500  NA 136 137  K 3.8 3.9  CL 100 105  CO2 26 24  GLUCOSE 94 102*  BUN 16 12  CREATININE 0.56 0.54  CALCIUM 9.5 9.1   ------------------------------------------------------------------------------------------------------------------ estimated creatinine clearance is 47.2 ml/min (by C-G formula based on Cr of 0.54). ------------------------------------------------------------------------------------------------------------------ No results found for this basename: HGBA1C,  in the last 72 hours ------------------------------------------------------------------------------------------------------------------ No results found for this basename: CHOL, HDL, LDLCALC, TRIG, CHOLHDL,  LDLDIRECT,  in the last 72 hours ------------------------------------------------------------------------------------------------------------------ No results found for this basename: TSH, T4TOTAL, FREET3, T3FREE, THYROIDAB,  in the last 72 hours ------------------------------------------------------------------------------------------------------------------ No results found for this basename: VITAMINB12, FOLATE, FERRITIN, TIBC, IRON, RETICCTPCT,  in the last 72 hours  Coagulation profile  Recent Labs Lab 07/25/13 1714  INR 1.28    No results found for this basename: DDIMER,  in the last 72 hours  Cardiac Enzymes No results found for this basename: CK, CKMB, TROPONINI, MYOGLOBIN,  in the last 168 hours ------------------------------------------------------------------------------------------------------------------ No components found with  this basename: POCBNP,      Time Spent in minutes   35   SINGH,PRASHANT K M.D on 07/26/2013 at 10:56 AM  Between 7am to 7pm - Pager - [phone]  After 7pm go to [email or web address] - password [email or web address]  And look for the night coverage person covering for me after hours  [facility] Office  [phone]

## 2013-07-27 DIAGNOSIS — D649 Anemia, unspecified: Secondary | ICD-10-CM | POA: Diagnosis not present

## 2013-07-27 DIAGNOSIS — M169 Osteoarthritis of hip, unspecified: Secondary | ICD-10-CM | POA: Diagnosis not present

## 2013-07-27 DIAGNOSIS — K921 Melena: Secondary | ICD-10-CM | POA: Diagnosis not present

## 2013-07-27 DIAGNOSIS — R42 Dizziness and giddiness: Secondary | ICD-10-CM | POA: Diagnosis not present

## 2013-07-27 LAB — CBC
MCH: 31 pg (ref 26.0–34.0)
MCHC: 32.9 g/dL (ref 30.0–36.0)
MCV: 94.2 fL (ref 78.0–100.0)
Platelets: 413 10*3/uL — ABNORMAL HIGH (ref 150–400)
RBC: 2.58 MIL/uL — ABNORMAL LOW (ref 3.87–5.11)
WBC: 8.9 10*3/uL (ref 4.0–10.5)

## 2013-07-27 LAB — BASIC METABOLIC PANEL
CO2: 29 mEq/L (ref 19–32)
Chloride: 101 mEq/L (ref 96–112)
Creatinine, Ser: 0.71 mg/dL (ref 0.50–1.10)
GFR calc non Af Amer: 80 mL/min — ABNORMAL LOW (ref >90)

## 2013-07-27 LAB — OCCULT BLOOD X 1 CARD TO LAB, STOOL: Fecal Occult Bld: NEGATIVE

## 2013-07-27 MED ORDER — PANTOPRAZOLE SODIUM 40 MG PO TBEC: 40.0000 mg | DELAYED_RELEASE_TABLET | Freq: Two times a day (BID) | ORAL | Status: DC

## 2013-07-27 NOTE — Discharge Summary (Signed)
[facility]                                                                                   Lori [name], is a 79 y.o. female  DOB 1932/08/30  MRN [id number].  Admission date:  07/25/2013  Admitting Physician  Jeralyn Bennett, MD  Discharge Date:  07/27/2013   Primary MD   Duane Lope, MD  Recommendations for primary care physician for things to follow:   Follow CBC closely   Admission Diagnosis  Dizziness [780.4] Weakness [780.79] Anemia [285.9] Postoperative anemia due to acute blood loss [285.1] OA (osteoarthritis) of hip [715.95]  Discharge Diagnosis   melena unclear source, EGD unremarkable  Active Problems:   OA (osteoarthritis) of hip   Postoperative anemia due to acute blood loss   Melena   Weakness   Dizziness      Past Medical History  Diagnosis Date  . Complication of anesthesia     SLOW TO WAKE UP   . Hypertension     Can get hypotensive at times per patient  . Palpitations   . Arthritis   . Rash     rt groin  . Frequent bowel movements   . GERD (gastroesophageal reflux disease)     10 lb wt loss past 3 yrs  . Anemia     Past Surgical History  Procedure Laterality Date  . Appendectomy    . Tonsillectomy    . Gynecologic cryosurgery    . Total hip arthroplasty Right 07/17/2013    Procedure: RIGHT TOTAL HIP ARTHROPLASTY ANTERIOR APPROACH;  Surgeon: Loanne Drilling, MD;  Location: [facility] ORS;  Service: Orthopedics;  Laterality: Right;     Discharge Condition: Stable   Follow-up Information   Follow up with  Duane Lope, MD. Schedule an appointment as soon as possible for a visit in 2 days.   Specialty:  Family Medicine   Contact information:   [address] [place] [address] [phone]       Follow up with Loanne Drilling, MD. Schedule an appointment as soon as possible for a visit in 1 week.   Specialty:  Orthopedic Surgery   Contact information:   [address] [place] [address] [phone]        Follow up with Florencia Reasons, MD. Schedule an appointment as soon as possible for a visit in 1 week.   Specialty:  Gastroenterology   Contact information:   [address] [place] [address] [phone]         Consults obtained - GI Dr. Matthias Hughs   Discharge Medications      Medication List    STOP taking these medications       aspirin 81 MG chewable tablet      TAKE these medications       acetaminophen 500 MG tablet  Commonly known as:  TYLENOL  Take 1,000 mg by mouth every 6 (six) hours as needed for pain.     benazepril 10 MG tablet  Commonly known as:  LOTENSIN  Take 10 mg by mouth every morning.     cholecalciferol 1000 UNITS tablet  Commonly known as:  VITAMIN D  Take 1,000 Units by mouth daily.     ferrous sulfate 325 (65 FE) MG tablet  Take 325 mg by mouth daily with breakfast.     methocarbamol 500 MG tablet  Commonly known as:  ROBAXIN  Take 1 tablet (500 mg total) by mouth every 6 (six) hours as needed.     metoprolol tartrate 25 MG tablet  Commonly known as:  LOPRESSOR  Take 12.5 mg by mouth 2 (two) times daily.     pantoprazole 40 MG tablet  Commonly known as:  PROTONIX  Take 1 tablet (40 mg total) by mouth 2 (two) times daily.     PHILLIPS COLON HEALTH PO  Take 1 tablet by mouth daily.     rivaroxaban 10 MG Tabs tablet  Commonly known as:  XARELTO  - Take 1 tablet (10 mg total) by mouth daily with breakfast. Take Xarelto for two and a half more weeks, then discontinue Xarelto.  - Once the patient has completed the Xarelto, they may resume the 81 mg Aspirin.     simvastatin 40 MG tablet  Commonly known as:  ZOCOR  Take 20 mg by mouth every evening.     UNISOM 25 MG tablet  Generic drug:  doxylamine (Sleep)  Take 25 mg by mouth at bedtime as needed for sleep.         Diet and Activity recommendation: See Discharge Instructions below   Discharge Instructions     Follow with Primary MD  Duane Lope, MD in  2 days   Get CBC, CMP, checked 2 days by Primary MD and again as instructed by your Primary MD.   Get Medicines reviewed and adjusted.  Please request your Prim.MD to go over all Hospital Tests and Procedure/Radiological results at the follow up, please get all Hospital records sent to your Prim MD by signing hospital release before you go home.  Activity: As tolerated with Full fall precautions use walker/cane & assistance as needed   Diet: Heart healthy  For Heart failure patients - Check your Weight same time everyday, if you gain over 2 pounds, or you develop in leg swelling, experience more shortness of breath or chest pain, call your Primary MD immediately. Follow Cardiac Low Salt Diet and 1.8 lit/day fluid restriction.  Disposition Home    If you experience worsening of your admission symptoms, develop shortness of breath, life threatening emergency, suicidal or homicidal thoughts you must seek medical attention immediately by calling 911 or calling your MD immediately  if symptoms less severe.  You Must read complete instructions/literature along with all the possible adverse reactions/side effects for all the Medicines you take and that have been prescribed to you. Take any new Medicines after you have completely understood and accpet all the possible adverse reactions/side effects.   Do not drive and provide baby sitting services if your were admitted for syncope or siezures until you have seen by Primary MD or a Neurologist and advised to do so again.  Do not drive when taking Pain medications.    Do not take more than prescribed Pain, Sleep and Anxiety Medications  Special Instructions: If you have smoked or chewed Tobacco  in the last 2 yrs please stop smoking, stop any regular Alcohol  and or any Recreational drug use.  Wear Seat belts while driving.   Please note  You were cared for by a hospitalist during your hospital stay. If you have any questions about your  discharge medications or the care you received while you were in the hospital after you are discharged, you can call the unit and asked to speak with the hospitalist on call if the hospitalist that took care of you is not available. Once you are discharged, your primary care physician will handle any further medical issues. Please note that NO REFILLS for any discharge medications will be authorized once you are discharged, as it is imperative that you return to your primary care physician (or establish a relationship with a primary care physician if you do not have one) for your aftercare needs so that they can reassess your need for medications and monitor your lab values.   Major procedures and Radiology Reports - PLEASE review detailed and final reports for all details, in brief -   EGD  Endoscopy normal; findings discussed with Dr. Thedore Mins and with the patient and family.  It is now somewhat less likely, but nonetheless possible, that the patient did indeed have a GI bleed.  Recommendations:  1. I will advance her diet  2. I will switch her to an oral PPI, once daily. I would continue this medication for prophylaxis purposes as long as she is on Xarelto.  3. I favor restarting anticoagulation in this patient and monitoring carefully for evidence of recurring bleeding. This will be done by a combination of blood work and stool Hemoccults.  4. If the patient shows evidence of bleeding by a drop in hemoglobin or heme positive stools, we will have to consider further GI tract evaluation, which could include a small bowel capsule endoscopy, CT enterography, and/or colonoscopy (negative colonoscopy 4 years ago by Dr. Laural Benes).   Florencia Reasons, M.D.  [phone]   Micro Results     No results found for this or any previous visit (from the past 240 hour(s)).   History of present illness and  Hospital Course:     Kindly see H&P for history of present illness and admission details, please  review complete Labs, Consult reports and Test reports for all details in brief Lori [name], is a 79 y.o. female, patient with history of  right total hip arthroplasty done by Dr. Merlyn Albert on 07/17/2013 was on 10 mg of xaralto for DVT prophylaxis, also has history of hypertension, arthritis, chronic anemia, GERD came to the hospital with an episode of black tarry stool and lightheadedness, upon arrival to the ER she was found to be heme negative although stool amount was scant, her BUN was normal, her H&H was around 1 g less than her discharge H&H, she was admitted with a running diagnosis of possible upper GI bleed.  She did not require any transfusions and H&H remained stable throughout her hospital stay, she was seen by GI physician Dr. Matthias Hughs she underwent an EGD which was essentially unremarkable, Dr. Matthias Hughs recommended to resume xaralto and to monitor her clinically, she tolerated xaralto well and has had no further episodes suggestive of melena, plan is to discharge her on xaralto, no aspirin till she finishes her xaralto, twice a day PPI, close H&H monitoring by primary care physician along with close followup with GI in the outpatient setting. Patient and daughter both counseled that if dizziness with black stools or blood in stools reoccur to come back to the ER right away.   For her chronic medical problem of hypertension continue her home medications unchanged         Today   Subjective:   Lori [name] today has  no headache,no chest abdominal pain,no new weakness tingling or numbness, feels much better wants to go home today.   Objective:   Blood pressure 111/49, pulse 76, temperature 97.7 F (36.5 C), temperature source Oral, resp. rate 18, height 5\' 3"  (1.6 m), weight 53.7 kg (118 lb 6.2 oz), SpO2 100.00%.   Intake/Output Summary (Last 24 hours) at 07/27/13 0934 Last data filed at 07/27/13 0842  Gross per 24 hour  Intake   1320 ml  Output   2075 ml  Net   -755 ml     Exam Awake Alert, Oriented *3, No new F.N deficits, Normal affect [place].AT,PERRAL Supple Neck,No JVD, No cervical lymphadenopathy appriciated.  Symmetrical Chest wall movement, Good air movement bilaterally, CTAB RRR,No Gallops,Rubs or new Murmurs, No Parasternal Heave +ve B.Sounds, Abd Soft, Non tender, No organomegaly appriciated, No rebound -guarding or rigidity. No Cyanosis, Clubbing or edema, No new Rash or bruise  Data Review   CBC w Diff: Lab Results  Component Value Date   WBC 8.9 07/27/2013   HGB 8.0* 07/27/2013   HCT 24.3* 07/27/2013   PLT 413* 07/27/2013   LYMPHOPCT 33 06/28/2007   MONOPCT 9 06/28/2007   EOSPCT 1 06/28/2007   BASOPCT 0 06/28/2007    CMP: Lab Results  Component Value Date   NA 136 07/27/2013   K 4.3 07/27/2013   CL 101 07/27/2013   CO2 29 07/27/2013   BUN 14 07/27/2013   CREATININE 0.71 07/27/2013   PROT 7.2 07/10/2013   ALBUMIN 4.1 07/10/2013   BILITOT 0.3 07/10/2013   ALKPHOS 50 07/10/2013   AST 18 07/10/2013   ALT 13 07/10/2013  .   Total Time in preparing paper work, data evaluation and todays exam - 35 minutes  Leroy Sea M.D on 07/27/2013 at 9:34 AM  [facility] Office  [phone]

## 2013-07-29 ENCOUNTER — Encounter (HOSPITAL_COMMUNITY): Payer: Self-pay | Admitting: Gastroenterology

## 2013-07-29 DIAGNOSIS — Z4800 Encounter for change or removal of nonsurgical wound dressing: Secondary | ICD-10-CM | POA: Diagnosis not present

## 2013-07-29 DIAGNOSIS — R799 Abnormal finding of blood chemistry, unspecified: Secondary | ICD-10-CM | POA: Diagnosis not present

## 2013-07-29 DIAGNOSIS — IMO0001 Reserved for inherently not codable concepts without codable children: Secondary | ICD-10-CM | POA: Diagnosis not present

## 2013-07-29 DIAGNOSIS — Z96649 Presence of unspecified artificial hip joint: Secondary | ICD-10-CM | POA: Diagnosis not present

## 2013-07-29 DIAGNOSIS — Z471 Aftercare following joint replacement surgery: Secondary | ICD-10-CM | POA: Diagnosis not present

## 2013-07-29 DIAGNOSIS — I10 Essential (primary) hypertension: Secondary | ICD-10-CM | POA: Diagnosis not present

## 2013-07-31 DIAGNOSIS — Z96649 Presence of unspecified artificial hip joint: Secondary | ICD-10-CM | POA: Diagnosis not present

## 2013-07-31 DIAGNOSIS — I10 Essential (primary) hypertension: Secondary | ICD-10-CM | POA: Diagnosis not present

## 2013-07-31 DIAGNOSIS — Z4800 Encounter for change or removal of nonsurgical wound dressing: Secondary | ICD-10-CM | POA: Diagnosis not present

## 2013-07-31 DIAGNOSIS — IMO0001 Reserved for inherently not codable concepts without codable children: Secondary | ICD-10-CM | POA: Diagnosis not present

## 2013-07-31 DIAGNOSIS — Z471 Aftercare following joint replacement surgery: Secondary | ICD-10-CM | POA: Diagnosis not present

## 2013-08-02 DIAGNOSIS — IMO0001 Reserved for inherently not codable concepts without codable children: Secondary | ICD-10-CM | POA: Diagnosis not present

## 2013-08-02 DIAGNOSIS — Z471 Aftercare following joint replacement surgery: Secondary | ICD-10-CM | POA: Diagnosis not present

## 2013-08-02 DIAGNOSIS — I10 Essential (primary) hypertension: Secondary | ICD-10-CM | POA: Diagnosis not present

## 2013-08-02 DIAGNOSIS — Z4800 Encounter for change or removal of nonsurgical wound dressing: Secondary | ICD-10-CM | POA: Diagnosis not present

## 2013-08-02 DIAGNOSIS — Z96649 Presence of unspecified artificial hip joint: Secondary | ICD-10-CM | POA: Diagnosis not present

## 2013-08-05 DIAGNOSIS — I10 Essential (primary) hypertension: Secondary | ICD-10-CM | POA: Diagnosis not present

## 2013-08-05 DIAGNOSIS — K922 Gastrointestinal hemorrhage, unspecified: Secondary | ICD-10-CM | POA: Diagnosis not present

## 2013-08-05 DIAGNOSIS — R002 Palpitations: Secondary | ICD-10-CM | POA: Diagnosis not present

## 2013-08-05 DIAGNOSIS — Z4800 Encounter for change or removal of nonsurgical wound dressing: Secondary | ICD-10-CM | POA: Diagnosis not present

## 2013-08-05 DIAGNOSIS — Z471 Aftercare following joint replacement surgery: Secondary | ICD-10-CM | POA: Diagnosis not present

## 2013-08-05 DIAGNOSIS — Z96649 Presence of unspecified artificial hip joint: Secondary | ICD-10-CM | POA: Diagnosis not present

## 2013-08-05 DIAGNOSIS — D500 Iron deficiency anemia secondary to blood loss (chronic): Secondary | ICD-10-CM | POA: Diagnosis not present

## 2013-08-05 DIAGNOSIS — IMO0001 Reserved for inherently not codable concepts without codable children: Secondary | ICD-10-CM | POA: Diagnosis not present

## 2013-08-06 DIAGNOSIS — Z471 Aftercare following joint replacement surgery: Secondary | ICD-10-CM | POA: Diagnosis not present

## 2013-08-06 DIAGNOSIS — I10 Essential (primary) hypertension: Secondary | ICD-10-CM | POA: Diagnosis not present

## 2013-08-06 DIAGNOSIS — IMO0001 Reserved for inherently not codable concepts without codable children: Secondary | ICD-10-CM | POA: Diagnosis not present

## 2013-08-06 DIAGNOSIS — Z4800 Encounter for change or removal of nonsurgical wound dressing: Secondary | ICD-10-CM | POA: Diagnosis not present

## 2013-08-06 DIAGNOSIS — Z96649 Presence of unspecified artificial hip joint: Secondary | ICD-10-CM | POA: Diagnosis not present

## 2013-08-07 DIAGNOSIS — Z471 Aftercare following joint replacement surgery: Secondary | ICD-10-CM | POA: Diagnosis not present

## 2013-08-07 DIAGNOSIS — Z4800 Encounter for change or removal of nonsurgical wound dressing: Secondary | ICD-10-CM | POA: Diagnosis not present

## 2013-08-07 DIAGNOSIS — Z96649 Presence of unspecified artificial hip joint: Secondary | ICD-10-CM | POA: Diagnosis not present

## 2013-08-07 DIAGNOSIS — IMO0001 Reserved for inherently not codable concepts without codable children: Secondary | ICD-10-CM | POA: Diagnosis not present

## 2013-08-07 DIAGNOSIS — I10 Essential (primary) hypertension: Secondary | ICD-10-CM | POA: Diagnosis not present

## 2013-08-27 DIAGNOSIS — M169 Osteoarthritis of hip, unspecified: Secondary | ICD-10-CM | POA: Diagnosis not present

## 2013-08-27 DIAGNOSIS — Z96649 Presence of unspecified artificial hip joint: Secondary | ICD-10-CM | POA: Diagnosis not present

## 2013-08-28 DIAGNOSIS — K922 Gastrointestinal hemorrhage, unspecified: Secondary | ICD-10-CM | POA: Diagnosis not present

## 2013-09-02 DIAGNOSIS — K921 Melena: Secondary | ICD-10-CM | POA: Diagnosis not present

## 2013-09-05 DIAGNOSIS — R198 Other specified symptoms and signs involving the digestive system and abdomen: Secondary | ICD-10-CM | POA: Diagnosis not present

## 2013-09-05 DIAGNOSIS — D500 Iron deficiency anemia secondary to blood loss (chronic): Secondary | ICD-10-CM | POA: Diagnosis not present

## 2013-09-05 DIAGNOSIS — K921 Melena: Secondary | ICD-10-CM | POA: Diagnosis not present

## 2013-09-16 DIAGNOSIS — R002 Palpitations: Secondary | ICD-10-CM | POA: Diagnosis not present

## 2013-09-16 DIAGNOSIS — D500 Iron deficiency anemia secondary to blood loss (chronic): Secondary | ICD-10-CM | POA: Diagnosis not present

## 2013-11-01 DIAGNOSIS — D500 Iron deficiency anemia secondary to blood loss (chronic): Secondary | ICD-10-CM | POA: Diagnosis not present

## 2013-11-01 DIAGNOSIS — K921 Melena: Secondary | ICD-10-CM | POA: Diagnosis not present

## 2013-11-01 DIAGNOSIS — K5730 Diverticulosis of large intestine without perforation or abscess without bleeding: Secondary | ICD-10-CM | POA: Diagnosis not present

## 2013-11-05 DIAGNOSIS — D500 Iron deficiency anemia secondary to blood loss (chronic): Secondary | ICD-10-CM | POA: Diagnosis not present

## 2013-12-09 DIAGNOSIS — R5382 Chronic fatigue, unspecified: Secondary | ICD-10-CM | POA: Diagnosis not present

## 2013-12-09 DIAGNOSIS — K921 Melena: Secondary | ICD-10-CM | POA: Diagnosis not present

## 2013-12-09 DIAGNOSIS — D500 Iron deficiency anemia secondary to blood loss (chronic): Secondary | ICD-10-CM | POA: Diagnosis not present

## 2013-12-09 DIAGNOSIS — G9332 Myalgic encephalomyelitis/chronic fatigue syndrome: Secondary | ICD-10-CM | POA: Diagnosis not present

## 2014-02-01 IMAGING — CR DG CHEST 2V
2 series · 2 of 2 positions shown · non-contrast
Comparison: None.

CLINICAL DATA: Hypertension.

EXAM:
CHEST  2 VIEW

[w chest pa]
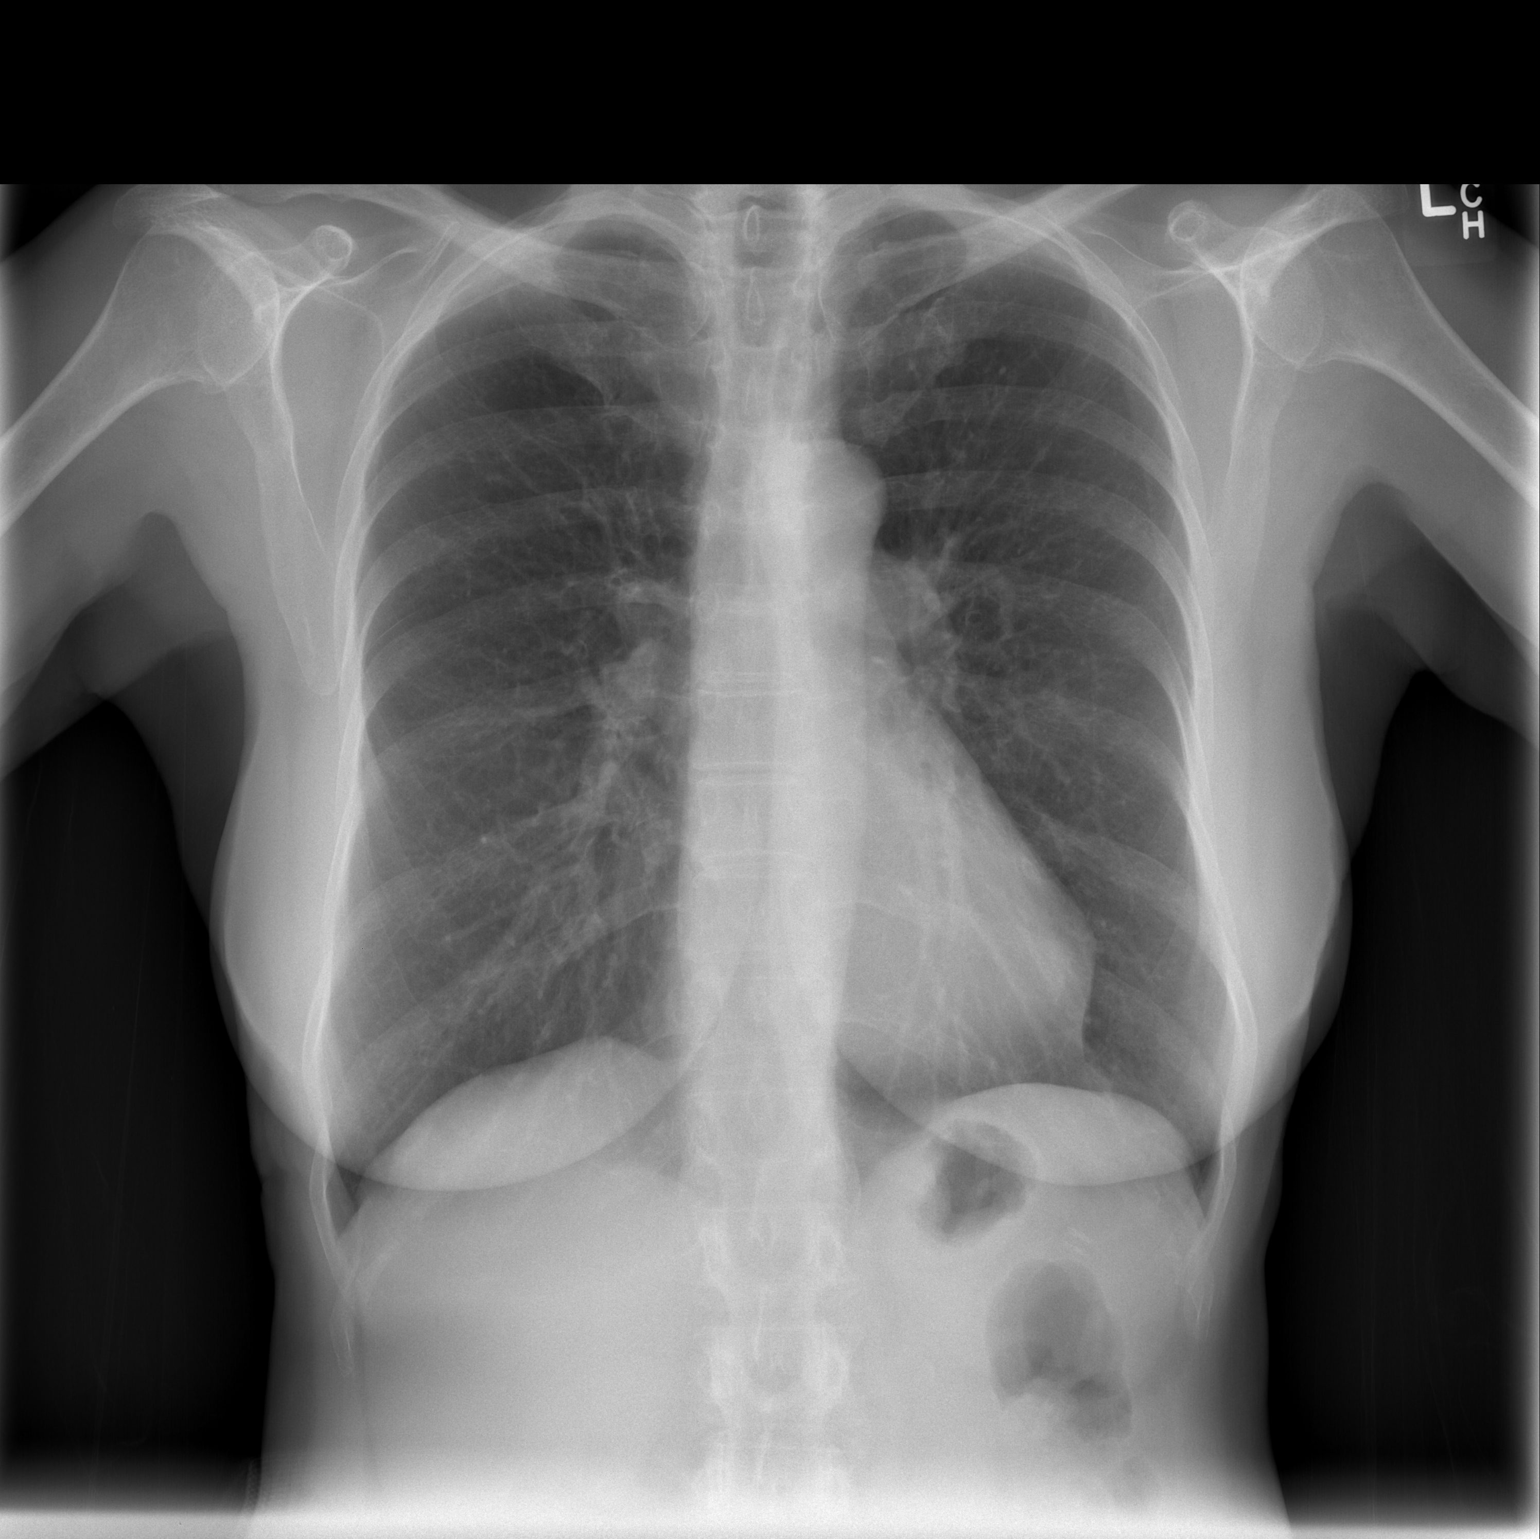

[w chest lat]
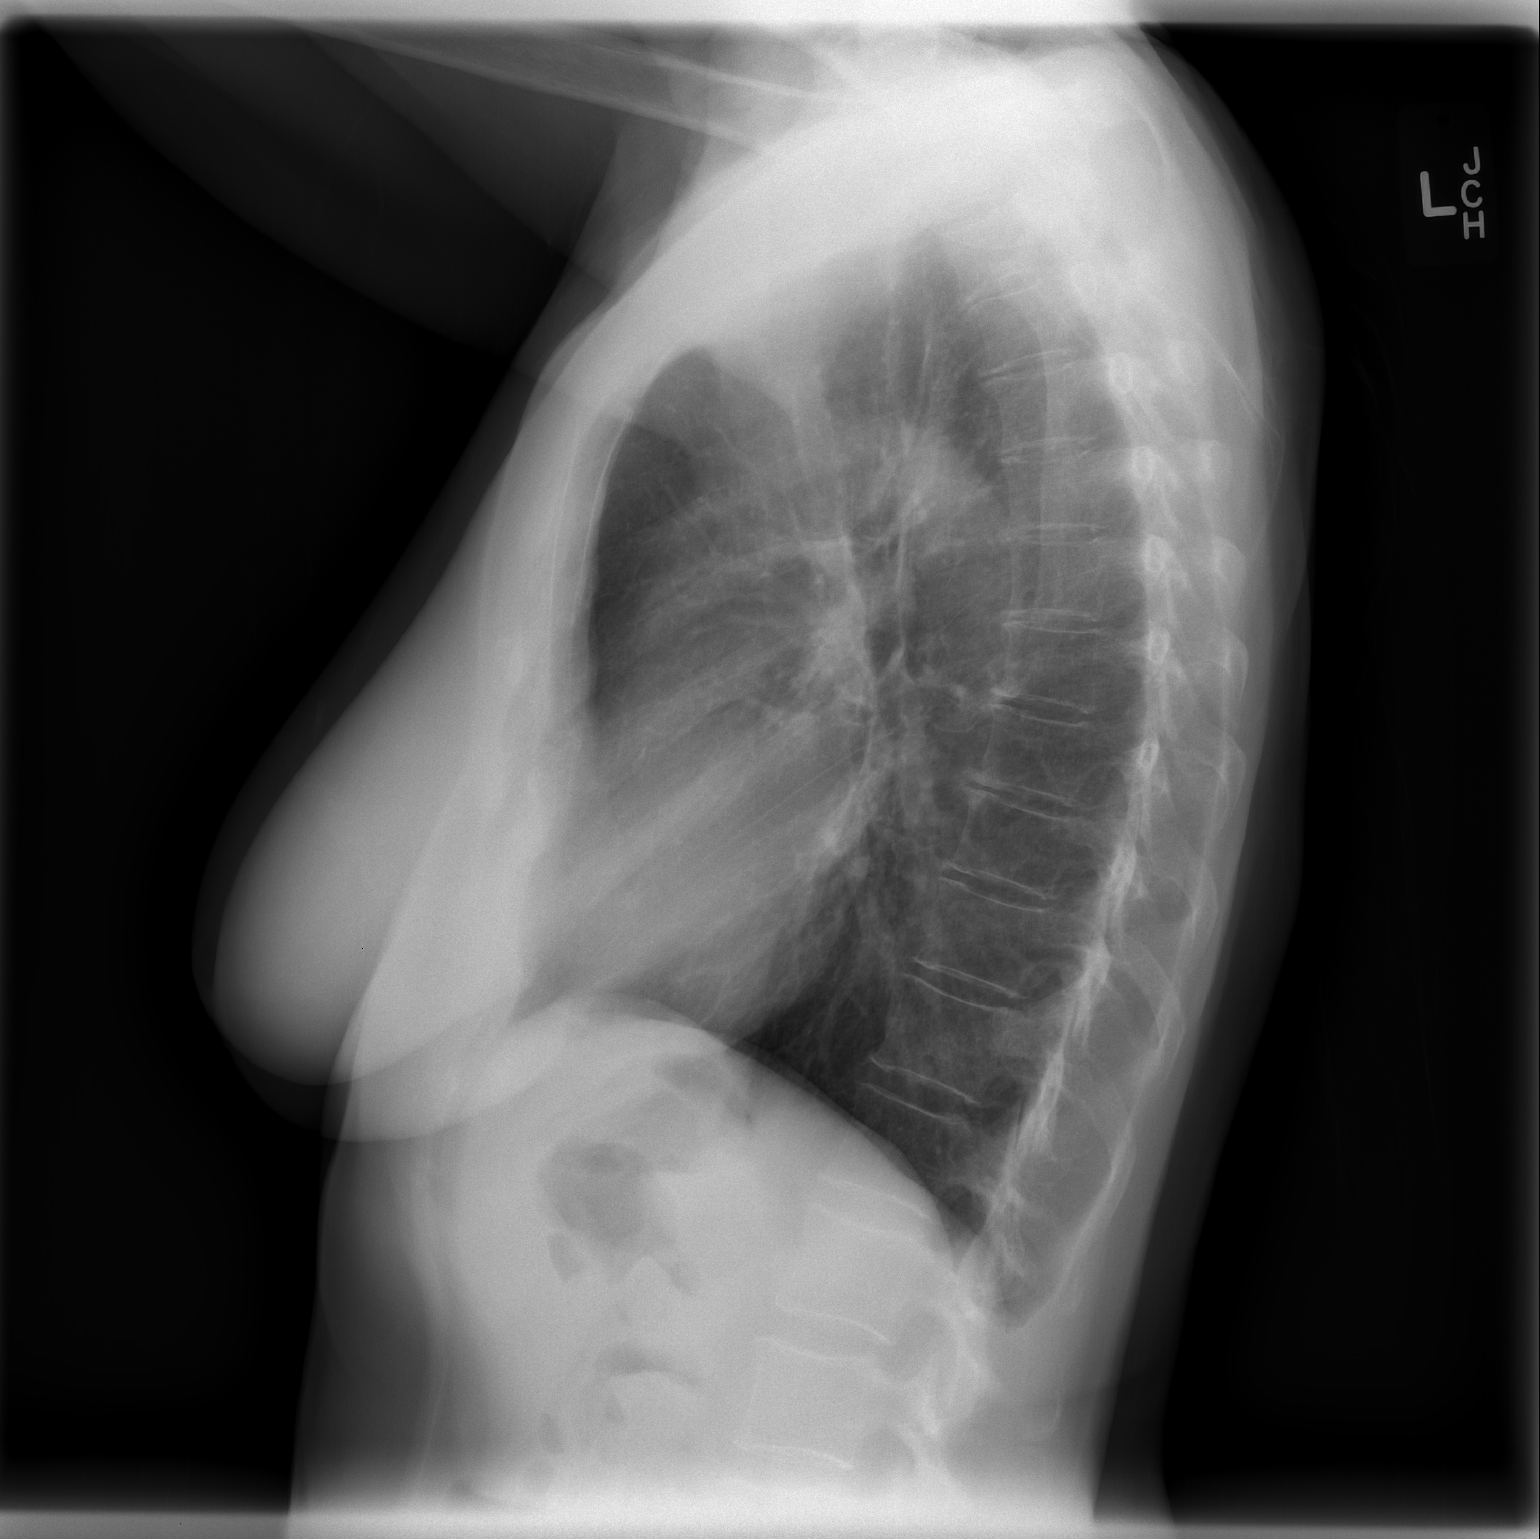

[2 of 2 positions shown; findings below may reference images not displayed]

FINDINGS: The heart size and mediastinal contours are within normal limits.
Both lungs are clear. The visualized skeletal structures are
unremarkable.
IMPRESSION: No active cardiopulmonary disease.

## 2014-02-08 IMAGING — CR DG PORTABLE PELVIS
1 series · 1 of 1 positions shown · non-contrast
Comparison: None.

CLINICAL DATA: Postop right total hip

EXAM:
PORTABLE PELVIS

[AP]
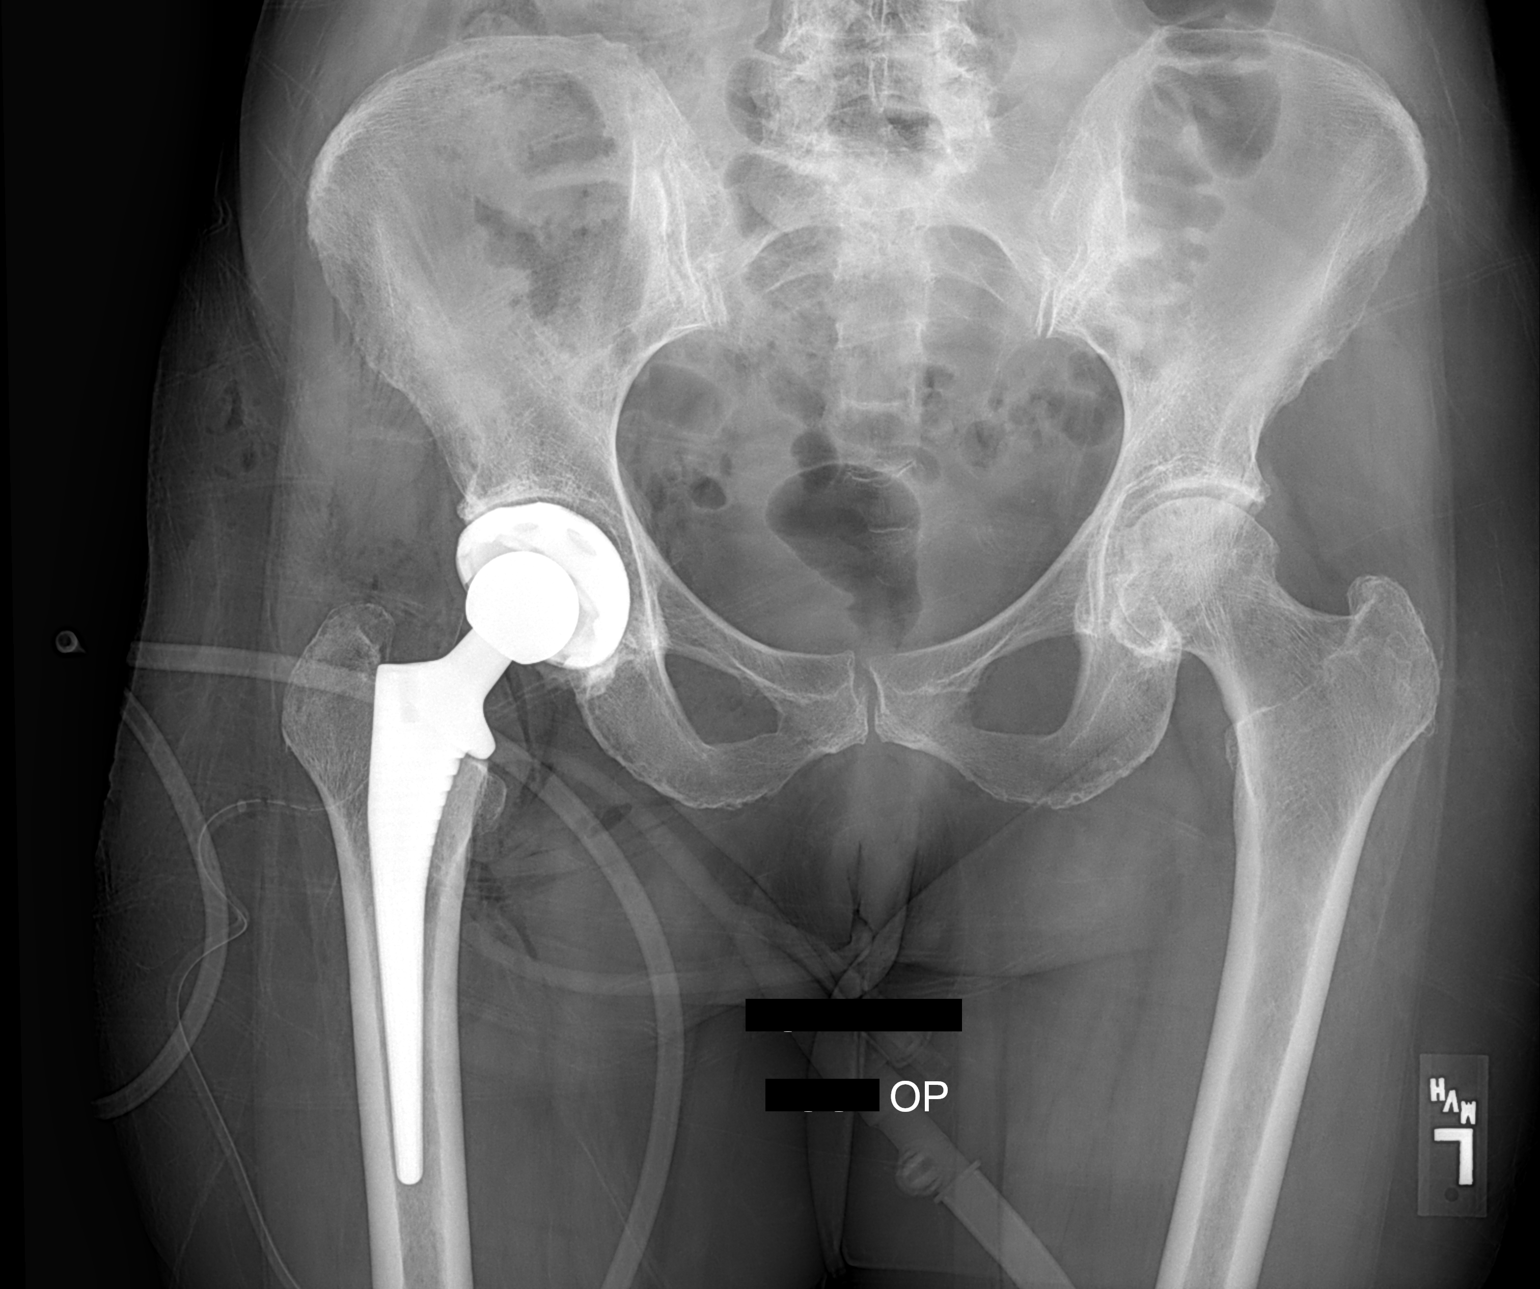

[1 of 1 positions shown; findings below may reference images not displayed]

FINDINGS: Right total hip arthroplasty in satisfactory position.

Associated subcutaneous gas and surgical drain.

No fracture or dislocation is seen.

Visualized bony pelvis appears intact.
IMPRESSION: Right total hip arthroplasty in satisfactory position.

## 2014-04-07 DIAGNOSIS — Z23 Encounter for immunization: Secondary | ICD-10-CM | POA: Diagnosis not present

## 2014-04-07 DIAGNOSIS — M79609 Pain in unspecified limb: Secondary | ICD-10-CM | POA: Diagnosis not present

## 2014-04-07 DIAGNOSIS — E7800 Pure hypercholesterolemia, unspecified: Secondary | ICD-10-CM | POA: Diagnosis not present

## 2014-04-07 DIAGNOSIS — D500 Iron deficiency anemia secondary to blood loss (chronic): Secondary | ICD-10-CM | POA: Diagnosis not present

## 2014-04-07 DIAGNOSIS — R002 Palpitations: Secondary | ICD-10-CM | POA: Diagnosis not present

## 2014-04-07 DIAGNOSIS — Z79899 Other long term (current) drug therapy: Secondary | ICD-10-CM | POA: Diagnosis not present

## 2014-04-30 DIAGNOSIS — Z9660 Presence of unspecified orthopedic joint implant: Secondary | ICD-10-CM | POA: Diagnosis not present

## 2014-04-30 DIAGNOSIS — Z471 Aftercare following joint replacement surgery: Secondary | ICD-10-CM | POA: Diagnosis not present

## 2014-05-23 DIAGNOSIS — H2510 Age-related nuclear cataract, unspecified eye: Secondary | ICD-10-CM | POA: Diagnosis not present

## 2014-05-23 DIAGNOSIS — H43819 Vitreous degeneration, unspecified eye: Secondary | ICD-10-CM | POA: Diagnosis not present

## 2014-05-23 DIAGNOSIS — H401490 Capsular glaucoma with pseudoexfoliation of lens, unspecified eye, stage unspecified: Secondary | ICD-10-CM | POA: Diagnosis not present

## 2014-05-23 DIAGNOSIS — H409 Unspecified glaucoma: Secondary | ICD-10-CM | POA: Diagnosis not present

## 2014-05-30 DIAGNOSIS — Z23 Encounter for immunization: Secondary | ICD-10-CM | POA: Diagnosis not present

## 2014-06-19 DIAGNOSIS — Z1231 Encounter for screening mammogram for malignant neoplasm of breast: Secondary | ICD-10-CM | POA: Diagnosis not present

## 2014-06-20 DIAGNOSIS — L570 Actinic keratosis: Secondary | ICD-10-CM | POA: Diagnosis not present

## 2014-06-20 DIAGNOSIS — L821 Other seborrheic keratosis: Secondary | ICD-10-CM | POA: Diagnosis not present

## 2014-06-20 DIAGNOSIS — C44319 Basal cell carcinoma of skin of other parts of face: Secondary | ICD-10-CM | POA: Diagnosis not present

## 2014-08-06 DIAGNOSIS — C44319 Basal cell carcinoma of skin of other parts of face: Secondary | ICD-10-CM | POA: Diagnosis not present

## 2014-08-06 DIAGNOSIS — C44311 Basal cell carcinoma of skin of nose: Secondary | ICD-10-CM | POA: Diagnosis not present

## 2014-08-06 DIAGNOSIS — Z85828 Personal history of other malignant neoplasm of skin: Secondary | ICD-10-CM | POA: Diagnosis not present

## 2014-08-07 DIAGNOSIS — Z471 Aftercare following joint replacement surgery: Secondary | ICD-10-CM | POA: Diagnosis not present

## 2014-08-07 DIAGNOSIS — Z96641 Presence of right artificial hip joint: Secondary | ICD-10-CM | POA: Diagnosis not present

## 2014-10-01 DIAGNOSIS — R079 Chest pain, unspecified: Secondary | ICD-10-CM | POA: Diagnosis not present

## 2014-10-02 ENCOUNTER — Ambulatory Visit (INDEPENDENT_AMBULATORY_CARE_PROVIDER_SITE_OTHER): Payer: Medicare Other | Admitting: Cardiology

## 2014-10-02 ENCOUNTER — Encounter: Payer: Self-pay | Admitting: Cardiology

## 2014-10-02 VITALS — BP 110/70 | HR 76 | Ht 63.0 in | Wt 126.0 lb

## 2014-10-02 DIAGNOSIS — I10 Essential (primary) hypertension: Secondary | ICD-10-CM

## 2014-10-02 DIAGNOSIS — R079 Chest pain, unspecified: Secondary | ICD-10-CM | POA: Diagnosis not present

## 2014-10-02 NOTE — Progress Notes (Signed)
[place], [place] [place], [place]  [address] Phone: [phone] Fax:  [phone]  Date:  10/02/2014   ID:  Lori [name], DOB 1932/06/12, MRN [id number]  PCP:   Melinda Crutch, MD  Cardiologist:  Fransico Him, MD    History of Present Illness: Lori [name] is a 81 y.o. female with a history of HTN and dyslipidemia who presents today for evaluation of chest pain.  She saw her PCP yesterday after having a crushing pain between her breasts along with palptiation on 1/5pm.  She denied any SOB but was cold and shaking and took husbands SL NTG and the pain resolved but the next day "felt out of it".  The next day she had tingling in her left arm and chest.  She said that it felt like an elephant was on her chest.  Troponin ordered by PCP was normal.  She is now referred her for further evaluation.  She denies any reoccurence of CP.  She denies any DOE, palpitations, dizziness, LE edema, or syncope.   Wt Readings from Last 3 Encounters:  10/02/14 126 lb (57.153 kg)  07/25/13 118 lb 6.2 oz (53.7 kg)  07/17/13 122 lb 6 oz (55.509 kg)     Past Medical History  Diagnosis Date  . Complication of anesthesia     SLOW TO WAKE UP   . Hypertension     Can get hypotensive at times per patient  . Palpitations   . Arthritis   . Rash     rt groin  . Frequent bowel movements   . GERD (gastroesophageal reflux disease)     10 lb wt loss past 3 yrs  . Anemia     Current Outpatient Prescriptions  Medication Sig Dispense Refill  . acetaminophen (TYLENOL) 500 MG tablet Take 1,000 mg by mouth every 6 (six) hours as needed for pain.     . cholecalciferol (VITAMIN D) 1000 UNITS tablet Take 1,000 Units by mouth daily.    Marland Kitchen doxylamine, Sleep, (UNISOM) 25 MG tablet Take 25 mg by mouth at bedtime as needed for sleep.    . metoprolol tartrate (LOPRESSOR) 25 MG tablet Take 12.5 mg by mouth 2 (two) times daily.    Marland Kitchen omeprazole (PRILOSEC) 20 MG capsule Take 20 mg by mouth daily. 1 TAB EVERY OTHER DAY     . Probiotic Product (PHILLIPS COLON HEALTH PO) Take 1 tablet by mouth daily.     No current facility-administered medications for this visit.    Allergies:    Allergies  Allergen Reactions  . Codeine Nausea And Vomiting  . Tramadol Other (See Comments)    syncope    Social History:  The patient  reports that she has never smoked. She does not have any smokeless tobacco history on file. She reports that she drinks alcohol. She reports that she does not use illicit drugs.   Family History:  The patient's family history includes Heart attack in her father; Heart disease in her father, mother, and sister.   ROS:  Please see the history of present illness.      All other systems reviewed and negative.   PHYSICAL EXAM: VS:  BP 110/70 mmHg  Pulse 76  Ht 5\' 3"  (1.6 m)  Wt 126 lb (57.153 kg)  BMI 22.33 kg/m2 Well nourished, well developed, in no acute distress HEENT: normal Neck: no JVD Cardiac:  normal S1, S2; RRR; no murmur Lungs:  clear to auscultation bilaterally, no wheezing, rhonchi or  rales Abd: soft, nontender, no hepatomegaly Ext: no edema Skin: warm and dry Neuro:  CNs 2-12 intact, no focal abnormalities noted  EKG:     NSR with low voltage QRS and rSR'  ASSESSMENT AND PLAN:  1. Chest pain somewhat atypical but resolved with SL NTG.  Her CRF include postmenopausal state, HTN and strong family history of CAD.  I have recommended a stress myoview to rule out ischemia and 2D echo to assess LVF>  2. HTN - well controlled.  Continue Lopressor  Followup pending results of studies  Signed, Fransico Him, MD [facility] 10/02/2014 3:39 PM

## 2014-10-02 NOTE — Patient Instructions (Signed)
Dr. Radford Pax recommends you have a STRESS MYOVIEW.  Your physician has requested that you have an echocardiogram. Echocardiography is a painless test that uses sound waves to create images of your heart. It provides your doctor with information about the size and shape of your heart and how well your heart's chambers and valves are working. This procedure takes approximately one hour. There are no restrictions for this procedure.  Your physician recommends that you schedule a follow-up appointment with Dr. Radford Pax AS NEEDED pending your tests.

## 2014-10-03 ENCOUNTER — Ambulatory Visit (HOSPITAL_COMMUNITY): Payer: Medicare Other | Attending: Cardiovascular Disease | Admitting: Cardiology

## 2014-10-03 DIAGNOSIS — R079 Chest pain, unspecified: Secondary | ICD-10-CM | POA: Diagnosis not present

## 2014-10-03 NOTE — Progress Notes (Signed)
Echo performed. 

## 2014-10-08 ENCOUNTER — Ambulatory Visit (HOSPITAL_COMMUNITY): Payer: Medicare Other | Attending: Cardiovascular Disease | Admitting: Radiology

## 2014-10-08 DIAGNOSIS — I10 Essential (primary) hypertension: Secondary | ICD-10-CM | POA: Diagnosis not present

## 2014-10-08 DIAGNOSIS — R079 Chest pain, unspecified: Secondary | ICD-10-CM | POA: Diagnosis present

## 2014-10-08 MED ORDER — TECHNETIUM TC 99M SESTAMIBI GENERIC - CARDIOLITE
30.0000 | Freq: Once | INTRAVENOUS | Status: AC | PRN
  Administered 2014-10-08: 30 via INTRAVENOUS

## 2014-10-08 MED ORDER — TECHNETIUM TC 99M SESTAMIBI GENERIC - CARDIOLITE
10.0000 | Freq: Once | INTRAVENOUS | Status: AC | PRN
  Administered 2014-10-08: 10 via INTRAVENOUS

## 2014-10-08 NOTE — Progress Notes (Signed)
[facility] [address] NUCLEAR MED [address] [place], [place] [address] [phone]    Cardiology Nuclear Med Study  Lori [name] is a 81 y.o. female     MRN : [id number]     DOB: 12-05-1931  Procedure Date: 10/08/2014  Nuclear Med Background Indication for Stress Test:  Evaluation for Ischemia History:  no prior hx CAD Cardiac Risk Factors: Hypertension  Symptoms:  Chest Pain   Nuclear Pre-Procedure Caffeine/Decaff Intake:  None NPO After: 7:00pm   Lungs:  clear O2 Sat: 98% on room air. IV 0.9% NS with Angio Cath:  22g  IV Site: R Hand  IV Started by:  Matilde Haymaker, RN  Chest Size (in):  36 Cup Size: C  Height: 5\' 3"  (1.6 m)  Weight:  125 lb (56.7 kg)  BMI:  Body mass index is 22.15 kg/(m^2). Tech Comments:  Held Child psychotherapist Med Study 1 or 2 day study: 1 day  Stress Test Type:  Stress  Reading MD: Dola Argyle, MD  Order Authorizing Provider:  T. Turner, MD  Resting Radionuclide: Technetium 72m Sestamibi  Resting Radionuclide Dose: 11.0 mCi   Stress Radionuclide:  Technetium 20m Sestamibi  Stress Radionuclide Dose: 33.0 mCi           Stress Protocol Rest HR: 78 Stress HR: 160  Rest BP: 123/74 Stress BP: 208/57  Exercise Time (min): 6:00 METS: 7.0   Predicted Max HR: 140 bpm % Max HR: 114.29 bpm Rate Pressure Product: 33280   Dose of Adenosine (mg):  n/a Dose of Lexiscan: n/a mg  Dose of Atropine (mg): n/a Dose of Dobutamine: n/a mcg/kg/min (at max HR)  Stress Test Technologist: Glade Lloyd, BS-ES  Nuclear Technologist:  Earl Many, CNMT     Rest Procedure:  Myocardial perfusion imaging was performed at rest 45 minutes following the intravenous administration of Technetium 85m Sestamibi. Rest ECG: Normal sinus rhythm. Incomplete right bundle branch block.  Stress Procedure:  The patient exercised on the treadmill utilizing the Bruce Protocol for 6:00 minutes. The patient stopped due to fatigue, pressure in ears and  denied any chest pain.  Technetium 104m Sestamibi was injected at peak exercise and myocardial perfusion imaging was performed after a brief delay. Stress ECG: No significant change from baseline ECG  QPS Raw Data Images:  Normal; no motion artifact; normal heart/lung ratio. Stress Images:  Normal homogeneous uptake in all areas of the myocardium. Rest Images:  Normal homogeneous uptake in all areas of the myocardium. Subtraction (SDS):  No evidence of ischemia. Transient Ischemic Dilatation (Normal <1.22):  0.87 Lung/Heart Ratio (Normal <0.45):  0.31  Quantitative Gated Spect Images QGS EDV:  41 ml QGS ESV:  9 ml  Impression Exercise Capacity:  Good exercise capacity. BP Response:  Normal blood pressure response. Clinical Symptoms:  Patient has a sensation of fullness in his ears ECG Impression:  No significant ST segment change suggestive of ischemia. Comparison with Prior Nuclear Study: No images to compare  Overall Impression:  Normal stress nuclear study.  There is no scar or ischemia. There is normal wall motion. This is a low risk scan. The patient has very good exercise tolerance for her age.  LV Ejection Fraction: 78%.  LV Wall Motion:  Normal Wall Motion   Dola Argyle, MD

## 2014-10-28 NOTE — Progress Notes (Signed)
Cardiology Office Note   Date:  10/29/2014   ID:  Lori [name], DOB 1931/10/06, MRN [id number]  PCP:   Melinda Crutch, MD  Cardiologist:   Sueanne Margarita, MD   Chief Complaint  Patient presents with  . Chest Pain      History of Present Illness: Lori [name] is a 81 y.o. female with a history of HTN and dyslipidemia who presents today for followup of chest pain. She saw me recently after having a crushing pain between her breasts.. She denied any SOB but was cold and shaking and took husbands SL NTG and the pain resolved but the next day "felt out of it". The next day she had tingling in her left arm and chest. She said that it felt like an elephant was on her chest. Troponin ordered by PCP was normal. She underwent nuclear stress test that showed no ischemia and normal LVF. 2D echo was normal. Since I saw her last she has not had any further CP.  She has not have any further palpitations either.       Past Medical History  Diagnosis Date  . Complication of anesthesia     SLOW TO WAKE UP   . Hypertension     Can get hypotensive at times per patient  . Palpitations   . Arthritis   . Rash     rt groin  . Frequent bowel movements   . GERD (gastroesophageal reflux disease)     10 lb wt loss past 3 yrs  . Anemia     Past Surgical History  Procedure Laterality Date  . Appendectomy    . Tonsillectomy    . Gynecologic cryosurgery    . Total hip arthroplasty Right 07/17/2013    Procedure: RIGHT TOTAL HIP ARTHROPLASTY ANTERIOR APPROACH;  Surgeon: Gearlean Alf, MD;  Location: [facility];  Service: Orthopedics;  Laterality: Right;  . Esophagogastroduodenoscopy N/A 07/26/2013    Procedure: ESOPHAGOGASTRODUODENOSCOPY (EGD);  Surgeon: Cleotis Nipper, MD;  Location: [facility];  Service: Endoscopy;  Laterality: N/A;     Current Outpatient Prescriptions  Medication Sig Dispense Refill  . cholecalciferol (VITAMIN D) 1000 UNITS tablet Take 1,000 Units by mouth daily.     Marland Kitchen doxylamine, Sleep, (UNISOM) 25 MG tablet Take 25 mg by mouth at bedtime as needed for sleep.    . metoprolol tartrate (LOPRESSOR) 25 MG tablet Take 12.5 mg by mouth 2 (two) times daily.    Marland Kitchen omeprazole (PRILOSEC) 20 MG capsule Take 20 mg by mouth daily. 1 TAB EVERY OTHER DAY     No current facility-administered medications for this visit.    Allergies:   Codeine and Tramadol    Social History:  The patient  reports that she has never smoked. She does not have any smokeless tobacco history on file. She reports that she drinks alcohol. She reports that she does not use illicit drugs.   Family History:  The patient's family history includes Heart attack in her father; Heart disease in her father, mother, and sister.    ROS:  Please see the history of present illness.   Otherwise, review of systems are positive for none.   All other systems are reviewed and negative.    PHYSICAL EXAM: VS:  BP 130/62 mmHg  Pulse 94  Ht 5' 2.5" (1.588 m)  Wt 126 lb 1.9 oz (57.208 kg)  BMI 22.69 kg/m2  SpO2 99% , BMI Body mass index is 22.69 kg/(m^2). GEN: Well nourished, well  developed, in no acute distress HEENT: normal Neck: no JVD, carotid bruits, or masses Cardiac: RRR; no murmurs, rubs, or gallops,no edema  Respiratory:  clear to auscultation bilaterally, normal work of breathing GI: soft, nontender, nondistended, + BS MS: no deformity or atrophy Skin: warm and dry, no rash Neuro:  Strength and sensation are intact Psych: euthymic mood, full affect   EKG:  EKG is not ordered today.    Recent Labs: No results found for requested labs within last 365 days.    Lipid Panel No results found for: CHOL, TRIG, HDL, CHOLHDL, VLDL, LDLCALC, LDLDIRECT    Wt Readings from Last 3 Encounters:  10/29/14 126 lb 1.9 oz (57.208 kg)  10/08/14 125 lb (56.7 kg)  10/02/14 126 lb (57.153 kg)       ASSESSMENT AND PLAN:  1. Chest pain somewhat atypical but resolved with SL NTG. Her CRF  include postmenopausal state, HTN and strong family history of CAD. Stress myoview showed no ischemia and 2D echo showed normal LVF.  I suspect that her epidsode of CP was GI related and may have been esophageal spasm given her CP which resolved with NTG SL.   2. HTN - well controlled. Continue Lopressor 3. GERD - I have recommended that she followup with Dr. Watt Climes for her GERD.  She had cut her omeprazole back to QOD prior to the CP episode but is now back on daily omeprazole.     Current medicines are reviewed at length with the patient today.  The patient does not have concerns regarding medicines.  The following changes have been made:  no change  Labs/ tests ordered today include: None     Disposition:   FU with me PRN   Signed, Sueanne Margarita, MD  10/29/2014 10:20 AM    [facility] [place], [place], [place]  [address] Phone: [phone]; Fax: [phone]

## 2014-10-29 ENCOUNTER — Encounter: Payer: Self-pay | Admitting: Cardiology

## 2014-10-29 ENCOUNTER — Ambulatory Visit (INDEPENDENT_AMBULATORY_CARE_PROVIDER_SITE_OTHER): Payer: Medicare Other | Admitting: Cardiology

## 2014-10-29 VITALS — BP 130/62 | HR 94 | Ht 62.5 in | Wt 126.1 lb

## 2014-10-29 DIAGNOSIS — I10 Essential (primary) hypertension: Secondary | ICD-10-CM

## 2014-10-29 DIAGNOSIS — R079 Chest pain, unspecified: Secondary | ICD-10-CM | POA: Diagnosis not present

## 2014-10-29 NOTE — Patient Instructions (Signed)
Your physician recommends that you continue on your current medications as directed. Please refer to the Current Medication list given to you today.  Your physician recommends that you schedule a follow-up appointment AS NEEDED with Dr. Radford Pax.

## 2015-02-03 DIAGNOSIS — E780 Pure hypercholesterolemia: Secondary | ICD-10-CM | POA: Diagnosis not present

## 2015-02-03 DIAGNOSIS — Z131 Encounter for screening for diabetes mellitus: Secondary | ICD-10-CM | POA: Diagnosis not present

## 2015-02-03 DIAGNOSIS — R002 Palpitations: Secondary | ICD-10-CM | POA: Diagnosis not present

## 2015-02-03 DIAGNOSIS — D500 Iron deficiency anemia secondary to blood loss (chronic): Secondary | ICD-10-CM | POA: Diagnosis not present

## 2015-02-11 DIAGNOSIS — Z85828 Personal history of other malignant neoplasm of skin: Secondary | ICD-10-CM | POA: Diagnosis not present

## 2015-02-11 DIAGNOSIS — C44712 Basal cell carcinoma of skin of right lower limb, including hip: Secondary | ICD-10-CM | POA: Diagnosis not present

## 2015-02-11 DIAGNOSIS — C44612 Basal cell carcinoma of skin of right upper limb, including shoulder: Secondary | ICD-10-CM | POA: Diagnosis not present

## 2015-02-11 DIAGNOSIS — D1801 Hemangioma of skin and subcutaneous tissue: Secondary | ICD-10-CM | POA: Diagnosis not present

## 2015-02-11 DIAGNOSIS — L821 Other seborrheic keratosis: Secondary | ICD-10-CM | POA: Diagnosis not present

## 2015-03-02 DIAGNOSIS — D500 Iron deficiency anemia secondary to blood loss (chronic): Secondary | ICD-10-CM | POA: Diagnosis not present

## 2015-03-02 DIAGNOSIS — Z131 Encounter for screening for diabetes mellitus: Secondary | ICD-10-CM | POA: Diagnosis not present

## 2015-03-02 DIAGNOSIS — E780 Pure hypercholesterolemia: Secondary | ICD-10-CM | POA: Diagnosis not present

## 2015-03-10 DIAGNOSIS — R1084 Generalized abdominal pain: Secondary | ICD-10-CM | POA: Diagnosis not present

## 2015-06-03 DIAGNOSIS — H401432 Capsular glaucoma with pseudoexfoliation of lens, bilateral, moderate stage: Secondary | ICD-10-CM | POA: Diagnosis not present

## 2015-06-03 DIAGNOSIS — H04123 Dry eye syndrome of bilateral lacrimal glands: Secondary | ICD-10-CM | POA: Diagnosis not present

## 2015-06-03 DIAGNOSIS — H2513 Age-related nuclear cataract, bilateral: Secondary | ICD-10-CM | POA: Diagnosis not present

## 2015-06-09 DIAGNOSIS — H25811 Combined forms of age-related cataract, right eye: Secondary | ICD-10-CM | POA: Diagnosis not present

## 2015-06-09 DIAGNOSIS — H25011 Cortical age-related cataract, right eye: Secondary | ICD-10-CM | POA: Diagnosis not present

## 2015-06-09 DIAGNOSIS — H2511 Age-related nuclear cataract, right eye: Secondary | ICD-10-CM | POA: Diagnosis not present

## 2015-07-21 DIAGNOSIS — Z1231 Encounter for screening mammogram for malignant neoplasm of breast: Secondary | ICD-10-CM | POA: Diagnosis not present

## 2015-08-06 DIAGNOSIS — Z23 Encounter for immunization: Secondary | ICD-10-CM | POA: Diagnosis not present

## 2015-08-12 DIAGNOSIS — L820 Inflamed seborrheic keratosis: Secondary | ICD-10-CM | POA: Diagnosis not present

## 2015-08-12 DIAGNOSIS — Z85828 Personal history of other malignant neoplasm of skin: Secondary | ICD-10-CM | POA: Diagnosis not present

## 2015-08-12 DIAGNOSIS — L570 Actinic keratosis: Secondary | ICD-10-CM | POA: Diagnosis not present

## 2015-08-12 DIAGNOSIS — D2262 Melanocytic nevi of left upper limb, including shoulder: Secondary | ICD-10-CM | POA: Diagnosis not present

## 2015-08-12 DIAGNOSIS — L821 Other seborrheic keratosis: Secondary | ICD-10-CM | POA: Diagnosis not present

## 2015-08-12 DIAGNOSIS — L814 Other melanin hyperpigmentation: Secondary | ICD-10-CM | POA: Diagnosis not present

## 2015-08-12 DIAGNOSIS — L720 Epidermal cyst: Secondary | ICD-10-CM | POA: Diagnosis not present

## 2015-12-07 DIAGNOSIS — L820 Inflamed seborrheic keratosis: Secondary | ICD-10-CM | POA: Diagnosis not present

## 2015-12-07 DIAGNOSIS — L570 Actinic keratosis: Secondary | ICD-10-CM | POA: Diagnosis not present

## 2015-12-07 DIAGNOSIS — Z85828 Personal history of other malignant neoplasm of skin: Secondary | ICD-10-CM | POA: Diagnosis not present

## 2015-12-11 DIAGNOSIS — L570 Actinic keratosis: Secondary | ICD-10-CM | POA: Diagnosis not present

## 2015-12-11 DIAGNOSIS — L821 Other seborrheic keratosis: Secondary | ICD-10-CM | POA: Diagnosis not present

## 2015-12-11 DIAGNOSIS — Z85828 Personal history of other malignant neoplasm of skin: Secondary | ICD-10-CM | POA: Diagnosis not present

## 2016-01-11 DIAGNOSIS — Z85828 Personal history of other malignant neoplasm of skin: Secondary | ICD-10-CM | POA: Diagnosis not present

## 2016-01-11 DIAGNOSIS — L570 Actinic keratosis: Secondary | ICD-10-CM | POA: Diagnosis not present

## 2016-01-13 DIAGNOSIS — H401431 Capsular glaucoma with pseudoexfoliation of lens, bilateral, mild stage: Secondary | ICD-10-CM | POA: Diagnosis not present

## 2016-01-13 DIAGNOSIS — H2512 Age-related nuclear cataract, left eye: Secondary | ICD-10-CM | POA: Diagnosis not present

## 2016-02-08 DIAGNOSIS — D500 Iron deficiency anemia secondary to blood loss (chronic): Secondary | ICD-10-CM | POA: Diagnosis not present

## 2016-02-08 DIAGNOSIS — R002 Palpitations: Secondary | ICD-10-CM | POA: Diagnosis not present

## 2016-02-08 DIAGNOSIS — E7800 Pure hypercholesterolemia, unspecified: Secondary | ICD-10-CM | POA: Diagnosis not present

## 2016-02-08 DIAGNOSIS — Z79899 Other long term (current) drug therapy: Secondary | ICD-10-CM | POA: Diagnosis not present

## 2016-03-03 DIAGNOSIS — Z96641 Presence of right artificial hip joint: Secondary | ICD-10-CM | POA: Diagnosis not present

## 2016-03-03 DIAGNOSIS — Z471 Aftercare following joint replacement surgery: Secondary | ICD-10-CM | POA: Diagnosis not present

## 2016-05-20 DIAGNOSIS — Z23 Encounter for immunization: Secondary | ICD-10-CM | POA: Diagnosis not present

## 2016-07-22 DIAGNOSIS — Z1231 Encounter for screening mammogram for malignant neoplasm of breast: Secondary | ICD-10-CM | POA: Diagnosis not present

## 2016-08-15 DIAGNOSIS — D225 Melanocytic nevi of trunk: Secondary | ICD-10-CM | POA: Diagnosis not present

## 2016-08-15 DIAGNOSIS — D1801 Hemangioma of skin and subcutaneous tissue: Secondary | ICD-10-CM | POA: Diagnosis not present

## 2016-08-15 DIAGNOSIS — D2262 Melanocytic nevi of left upper limb, including shoulder: Secondary | ICD-10-CM | POA: Diagnosis not present

## 2016-08-15 DIAGNOSIS — L821 Other seborrheic keratosis: Secondary | ICD-10-CM | POA: Diagnosis not present

## 2016-08-15 DIAGNOSIS — C44519 Basal cell carcinoma of skin of other part of trunk: Secondary | ICD-10-CM | POA: Diagnosis not present

## 2016-08-15 DIAGNOSIS — L820 Inflamed seborrheic keratosis: Secondary | ICD-10-CM | POA: Diagnosis not present

## 2016-08-15 DIAGNOSIS — Z85828 Personal history of other malignant neoplasm of skin: Secondary | ICD-10-CM | POA: Diagnosis not present

## 2016-08-25 DIAGNOSIS — M791 Myalgia: Secondary | ICD-10-CM | POA: Diagnosis not present

## 2016-09-02 DIAGNOSIS — M791 Myalgia: Secondary | ICD-10-CM | POA: Diagnosis not present

## 2016-09-05 DIAGNOSIS — M791 Myalgia: Secondary | ICD-10-CM | POA: Diagnosis not present

## 2016-09-07 DIAGNOSIS — M791 Myalgia: Secondary | ICD-10-CM | POA: Diagnosis not present

## 2016-09-12 DIAGNOSIS — M791 Myalgia: Secondary | ICD-10-CM | POA: Diagnosis not present

## 2016-09-14 DIAGNOSIS — M791 Myalgia: Secondary | ICD-10-CM | POA: Diagnosis not present

## 2016-09-20 DIAGNOSIS — M791 Myalgia: Secondary | ICD-10-CM | POA: Diagnosis not present

## 2016-09-22 DIAGNOSIS — M791 Myalgia: Secondary | ICD-10-CM | POA: Diagnosis not present

## 2017-01-18 DIAGNOSIS — H401431 Capsular glaucoma with pseudoexfoliation of lens, bilateral, mild stage: Secondary | ICD-10-CM | POA: Diagnosis not present

## 2017-01-18 DIAGNOSIS — H52203 Unspecified astigmatism, bilateral: Secondary | ICD-10-CM | POA: Diagnosis not present

## 2017-03-27 DIAGNOSIS — D500 Iron deficiency anemia secondary to blood loss (chronic): Secondary | ICD-10-CM | POA: Diagnosis not present

## 2017-03-27 DIAGNOSIS — R002 Palpitations: Secondary | ICD-10-CM | POA: Diagnosis not present

## 2017-03-27 DIAGNOSIS — G479 Sleep disorder, unspecified: Secondary | ICD-10-CM | POA: Diagnosis not present

## 2017-03-27 DIAGNOSIS — Z23 Encounter for immunization: Secondary | ICD-10-CM | POA: Diagnosis not present

## 2017-03-27 DIAGNOSIS — E7800 Pure hypercholesterolemia, unspecified: Secondary | ICD-10-CM | POA: Diagnosis not present

## 2017-03-27 DIAGNOSIS — Z131 Encounter for screening for diabetes mellitus: Secondary | ICD-10-CM | POA: Diagnosis not present

## 2017-03-27 DIAGNOSIS — R634 Abnormal weight loss: Secondary | ICD-10-CM | POA: Diagnosis not present

## 2017-07-03 DIAGNOSIS — Z23 Encounter for immunization: Secondary | ICD-10-CM | POA: Diagnosis not present

## 2017-09-06 DIAGNOSIS — Z1231 Encounter for screening mammogram for malignant neoplasm of breast: Secondary | ICD-10-CM | POA: Diagnosis not present

## 2017-09-15 DIAGNOSIS — C44612 Basal cell carcinoma of skin of right upper limb, including shoulder: Secondary | ICD-10-CM | POA: Diagnosis not present

## 2017-09-15 DIAGNOSIS — L298 Other pruritus: Secondary | ICD-10-CM | POA: Diagnosis not present

## 2017-09-15 DIAGNOSIS — L814 Other melanin hyperpigmentation: Secondary | ICD-10-CM | POA: Diagnosis not present

## 2017-09-15 DIAGNOSIS — D2262 Melanocytic nevi of left upper limb, including shoulder: Secondary | ICD-10-CM | POA: Diagnosis not present

## 2017-09-15 DIAGNOSIS — Z85828 Personal history of other malignant neoplasm of skin: Secondary | ICD-10-CM | POA: Diagnosis not present

## 2017-09-15 DIAGNOSIS — D2272 Melanocytic nevi of left lower limb, including hip: Secondary | ICD-10-CM | POA: Diagnosis not present

## 2017-09-15 DIAGNOSIS — L821 Other seborrheic keratosis: Secondary | ICD-10-CM | POA: Diagnosis not present

## 2017-09-15 DIAGNOSIS — D1801 Hemangioma of skin and subcutaneous tissue: Secondary | ICD-10-CM | POA: Diagnosis not present

## 2017-09-15 DIAGNOSIS — D0461 Carcinoma in situ of skin of right upper limb, including shoulder: Secondary | ICD-10-CM | POA: Diagnosis not present

## 2017-10-09 DIAGNOSIS — Z85828 Personal history of other malignant neoplasm of skin: Secondary | ICD-10-CM | POA: Diagnosis not present

## 2017-10-09 DIAGNOSIS — L821 Other seborrheic keratosis: Secondary | ICD-10-CM | POA: Diagnosis not present

## 2017-10-09 DIAGNOSIS — C44612 Basal cell carcinoma of skin of right upper limb, including shoulder: Secondary | ICD-10-CM | POA: Diagnosis not present

## 2018-02-02 DIAGNOSIS — H04121 Dry eye syndrome of right lacrimal gland: Secondary | ICD-10-CM | POA: Diagnosis not present

## 2018-02-02 DIAGNOSIS — H52203 Unspecified astigmatism, bilateral: Secondary | ICD-10-CM | POA: Diagnosis not present

## 2018-02-02 DIAGNOSIS — H2512 Age-related nuclear cataract, left eye: Secondary | ICD-10-CM | POA: Diagnosis not present

## 2018-03-21 DIAGNOSIS — M25569 Pain in unspecified knee: Secondary | ICD-10-CM | POA: Diagnosis not present

## 2018-05-03 DIAGNOSIS — R109 Unspecified abdominal pain: Secondary | ICD-10-CM | POA: Diagnosis not present

## 2018-05-03 DIAGNOSIS — R197 Diarrhea, unspecified: Secondary | ICD-10-CM | POA: Diagnosis not present

## 2018-05-03 DIAGNOSIS — E7800 Pure hypercholesterolemia, unspecified: Secondary | ICD-10-CM | POA: Diagnosis not present

## 2018-05-03 DIAGNOSIS — Z0000 Encounter for general adult medical examination without abnormal findings: Secondary | ICD-10-CM | POA: Diagnosis not present

## 2018-05-03 DIAGNOSIS — Z131 Encounter for screening for diabetes mellitus: Secondary | ICD-10-CM | POA: Diagnosis not present

## 2018-05-03 DIAGNOSIS — D500 Iron deficiency anemia secondary to blood loss (chronic): Secondary | ICD-10-CM | POA: Diagnosis not present

## 2018-05-03 DIAGNOSIS — G479 Sleep disorder, unspecified: Secondary | ICD-10-CM | POA: Diagnosis not present

## 2018-05-08 DIAGNOSIS — R197 Diarrhea, unspecified: Secondary | ICD-10-CM | POA: Diagnosis not present

## 2018-05-09 DIAGNOSIS — R197 Diarrhea, unspecified: Secondary | ICD-10-CM | POA: Diagnosis not present

## 2018-05-31 DIAGNOSIS — R197 Diarrhea, unspecified: Secondary | ICD-10-CM | POA: Diagnosis not present

## 2018-05-31 DIAGNOSIS — Z09 Encounter for follow-up examination after completed treatment for conditions other than malignant neoplasm: Secondary | ICD-10-CM | POA: Diagnosis not present

## 2018-05-31 DIAGNOSIS — Z23 Encounter for immunization: Secondary | ICD-10-CM | POA: Diagnosis not present

## 2018-05-31 DIAGNOSIS — H9209 Otalgia, unspecified ear: Secondary | ICD-10-CM | POA: Diagnosis not present

## 2018-09-07 DIAGNOSIS — Z1231 Encounter for screening mammogram for malignant neoplasm of breast: Secondary | ICD-10-CM | POA: Diagnosis not present

## 2018-09-07 DIAGNOSIS — Z803 Family history of malignant neoplasm of breast: Secondary | ICD-10-CM | POA: Diagnosis not present

## 2018-10-29 DIAGNOSIS — R634 Abnormal weight loss: Secondary | ICD-10-CM | POA: Diagnosis not present

## 2018-10-29 DIAGNOSIS — R112 Nausea with vomiting, unspecified: Secondary | ICD-10-CM | POA: Diagnosis not present

## 2018-10-29 DIAGNOSIS — R197 Diarrhea, unspecified: Secondary | ICD-10-CM | POA: Diagnosis not present

## 2018-10-29 DIAGNOSIS — Z8619 Personal history of other infectious and parasitic diseases: Secondary | ICD-10-CM | POA: Diagnosis not present

## 2018-10-31 DIAGNOSIS — Z8619 Personal history of other infectious and parasitic diseases: Secondary | ICD-10-CM | POA: Diagnosis not present

## 2018-10-31 DIAGNOSIS — R197 Diarrhea, unspecified: Secondary | ICD-10-CM | POA: Diagnosis not present

## 2018-11-02 DIAGNOSIS — D1801 Hemangioma of skin and subcutaneous tissue: Secondary | ICD-10-CM | POA: Diagnosis not present

## 2018-11-02 DIAGNOSIS — D2361 Other benign neoplasm of skin of right upper limb, including shoulder: Secondary | ICD-10-CM | POA: Diagnosis not present

## 2018-11-02 DIAGNOSIS — L814 Other melanin hyperpigmentation: Secondary | ICD-10-CM | POA: Diagnosis not present

## 2018-11-02 DIAGNOSIS — L570 Actinic keratosis: Secondary | ICD-10-CM | POA: Diagnosis not present

## 2018-11-02 DIAGNOSIS — D2261 Melanocytic nevi of right upper limb, including shoulder: Secondary | ICD-10-CM | POA: Diagnosis not present

## 2018-11-02 DIAGNOSIS — Z85828 Personal history of other malignant neoplasm of skin: Secondary | ICD-10-CM | POA: Diagnosis not present

## 2018-11-02 DIAGNOSIS — L821 Other seborrheic keratosis: Secondary | ICD-10-CM | POA: Diagnosis not present

## 2018-11-12 ENCOUNTER — Other Ambulatory Visit: Payer: Self-pay | Admitting: Physician Assistant

## 2018-11-12 DIAGNOSIS — R634 Abnormal weight loss: Secondary | ICD-10-CM

## 2018-11-12 DIAGNOSIS — R1012 Left upper quadrant pain: Secondary | ICD-10-CM

## 2018-11-13 ENCOUNTER — Ambulatory Visit
Admission: RE | Admit: 2018-11-13 | Discharge: 2018-11-13 | Disposition: A | Discharge status: Home / Self Care | Payer: Medicare Other | Source: Ambulatory Visit | Attending: Physician Assistant | Admitting: Physician Assistant

## 2018-11-13 DIAGNOSIS — R634 Abnormal weight loss: Secondary | ICD-10-CM

## 2018-11-13 DIAGNOSIS — R1012 Left upper quadrant pain: Secondary | ICD-10-CM | POA: Diagnosis not present

## 2018-12-10 DIAGNOSIS — R634 Abnormal weight loss: Secondary | ICD-10-CM | POA: Diagnosis not present

## 2018-12-10 DIAGNOSIS — R1012 Left upper quadrant pain: Secondary | ICD-10-CM | POA: Diagnosis not present

## 2019-02-08 DIAGNOSIS — H43811 Vitreous degeneration, right eye: Secondary | ICD-10-CM | POA: Diagnosis not present

## 2019-02-08 DIAGNOSIS — H401421 Capsular glaucoma with pseudoexfoliation of lens, left eye, mild stage: Secondary | ICD-10-CM | POA: Diagnosis not present

## 2019-02-08 DIAGNOSIS — H52203 Unspecified astigmatism, bilateral: Secondary | ICD-10-CM | POA: Diagnosis not present

## 2019-02-08 DIAGNOSIS — H2512 Age-related nuclear cataract, left eye: Secondary | ICD-10-CM | POA: Diagnosis not present

## 2019-05-29 DIAGNOSIS — Z23 Encounter for immunization: Secondary | ICD-10-CM | POA: Diagnosis not present

## 2019-05-29 DIAGNOSIS — D500 Iron deficiency anemia secondary to blood loss (chronic): Secondary | ICD-10-CM | POA: Diagnosis not present

## 2019-05-29 DIAGNOSIS — Z0000 Encounter for general adult medical examination without abnormal findings: Secondary | ICD-10-CM | POA: Diagnosis not present

## 2019-05-29 DIAGNOSIS — Z131 Encounter for screening for diabetes mellitus: Secondary | ICD-10-CM | POA: Diagnosis not present

## 2019-05-29 DIAGNOSIS — H9209 Otalgia, unspecified ear: Secondary | ICD-10-CM | POA: Diagnosis not present

## 2019-05-29 DIAGNOSIS — E7800 Pure hypercholesterolemia, unspecified: Secondary | ICD-10-CM | POA: Diagnosis not present

## 2019-06-03 DIAGNOSIS — J300 Vasomotor rhinitis: Secondary | ICD-10-CM | POA: Diagnosis not present

## 2019-06-03 DIAGNOSIS — H9203 Otalgia, bilateral: Secondary | ICD-10-CM | POA: Diagnosis not present

## 2019-07-27 ENCOUNTER — Encounter (INDEPENDENT_AMBULATORY_CARE_PROVIDER_SITE_OTHER): Payer: Self-pay

## 2019-10-06 ENCOUNTER — Ambulatory Visit: Payer: Medicare Other | Attending: Internal Medicine

## 2019-10-06 DIAGNOSIS — Z23 Encounter for immunization: Secondary | ICD-10-CM | POA: Diagnosis not present

## 2019-10-06 NOTE — Progress Notes (Signed)
   Covid-19 Vaccination Clinic  Name:  Lori [name]    MRN: [id number] DOB: 1932-03-26  10/06/2019  [name] was observed post Covid-19 immunization for 15 minutes without incidence. She was provided with Vaccine Information Sheet and instruction to access the V-Safe system.   [name] was instructed to call 911 with any severe reactions post vaccine: Marland Kitchen Difficulty breathing  . Swelling of your face and throat  . A fast heartbeat  . A bad rash all over your body  . Dizziness and weakness    Immunizations Administered    Name Date Dose VIS Date Route   Pfizer COVID-19 Vaccine 10/06/2019 12:06 PM 0.3 mL 09/06/2019 Intramuscular   Manufacturer: [place]   Lot: [id number]   [place]: [id number]

## 2019-10-22 DIAGNOSIS — Z1231 Encounter for screening mammogram for malignant neoplasm of breast: Secondary | ICD-10-CM | POA: Diagnosis not present

## 2019-10-24 ENCOUNTER — Ambulatory Visit: Payer: Medicare Other

## 2019-10-26 ENCOUNTER — Ambulatory Visit: Payer: Medicare Other | Attending: Internal Medicine

## 2019-10-26 DIAGNOSIS — Z23 Encounter for immunization: Secondary | ICD-10-CM | POA: Insufficient documentation

## 2019-10-26 NOTE — Progress Notes (Signed)
   Covid-19 Vaccination Clinic  Name:  Lori [name]    MRN: [id number] DOB: Feb 18, 1932  10/26/2019  [name] was observed post Covid-19 immunization for 15 minutes without incidence. She was provided with Vaccine Information Sheet and instruction to access the V-Safe system.   [name] was instructed to call 911 with any severe reactions post vaccine: Marland Kitchen Difficulty breathing  . Swelling of your face and throat  . A fast heartbeat  . A bad rash all over your body  . Dizziness and weakness    Immunizations Administered    Name Date Dose VIS Date Route   Pfizer COVID-19 Vaccine 10/26/2019  9:17 AM 0.3 mL 09/06/2019 Intramuscular   Manufacturer: [place]   Lot: [id number]   [place]: [id number]

## 2019-11-18 DIAGNOSIS — D1801 Hemangioma of skin and subcutaneous tissue: Secondary | ICD-10-CM | POA: Diagnosis not present

## 2019-11-18 DIAGNOSIS — L821 Other seborrheic keratosis: Secondary | ICD-10-CM | POA: Diagnosis not present

## 2019-11-18 DIAGNOSIS — Z85828 Personal history of other malignant neoplasm of skin: Secondary | ICD-10-CM | POA: Diagnosis not present

## 2019-11-18 DIAGNOSIS — L814 Other melanin hyperpigmentation: Secondary | ICD-10-CM | POA: Diagnosis not present

## 2019-11-18 DIAGNOSIS — D225 Melanocytic nevi of trunk: Secondary | ICD-10-CM | POA: Diagnosis not present

## 2020-02-10 DIAGNOSIS — H524 Presbyopia: Secondary | ICD-10-CM | POA: Diagnosis not present

## 2020-02-10 DIAGNOSIS — H401421 Capsular glaucoma with pseudoexfoliation of lens, left eye, mild stage: Secondary | ICD-10-CM | POA: Diagnosis not present

## 2020-02-10 DIAGNOSIS — H52203 Unspecified astigmatism, bilateral: Secondary | ICD-10-CM | POA: Diagnosis not present

## 2020-02-10 DIAGNOSIS — H2513 Age-related nuclear cataract, bilateral: Secondary | ICD-10-CM | POA: Diagnosis not present

## 2020-04-14 DIAGNOSIS — M545 Low back pain: Secondary | ICD-10-CM | POA: Diagnosis not present

## 2020-04-14 DIAGNOSIS — M5416 Radiculopathy, lumbar region: Secondary | ICD-10-CM | POA: Diagnosis not present

## 2020-04-17 DIAGNOSIS — M5136 Other intervertebral disc degeneration, lumbar region: Secondary | ICD-10-CM | POA: Diagnosis not present

## 2020-04-17 DIAGNOSIS — M545 Low back pain: Secondary | ICD-10-CM | POA: Diagnosis not present

## 2020-04-21 DIAGNOSIS — M5416 Radiculopathy, lumbar region: Secondary | ICD-10-CM | POA: Diagnosis not present

## 2020-05-01 DIAGNOSIS — M5417 Radiculopathy, lumbosacral region: Secondary | ICD-10-CM | POA: Diagnosis not present

## 2020-05-04 DIAGNOSIS — M545 Low back pain: Secondary | ICD-10-CM | POA: Diagnosis not present

## 2020-05-04 DIAGNOSIS — M79604 Pain in right leg: Secondary | ICD-10-CM | POA: Diagnosis not present

## 2020-05-04 DIAGNOSIS — R262 Difficulty in walking, not elsewhere classified: Secondary | ICD-10-CM | POA: Diagnosis not present

## 2020-05-04 DIAGNOSIS — M5416 Radiculopathy, lumbar region: Secondary | ICD-10-CM | POA: Diagnosis not present

## 2020-05-06 DIAGNOSIS — M5416 Radiculopathy, lumbar region: Secondary | ICD-10-CM | POA: Diagnosis not present

## 2020-05-06 DIAGNOSIS — R262 Difficulty in walking, not elsewhere classified: Secondary | ICD-10-CM | POA: Diagnosis not present

## 2020-05-06 DIAGNOSIS — M545 Low back pain: Secondary | ICD-10-CM | POA: Diagnosis not present

## 2020-05-06 DIAGNOSIS — M79604 Pain in right leg: Secondary | ICD-10-CM | POA: Diagnosis not present

## 2020-05-07 DIAGNOSIS — M5136 Other intervertebral disc degeneration, lumbar region: Secondary | ICD-10-CM | POA: Diagnosis not present

## 2020-05-12 DIAGNOSIS — M79604 Pain in right leg: Secondary | ICD-10-CM | POA: Diagnosis not present

## 2020-05-12 DIAGNOSIS — R262 Difficulty in walking, not elsewhere classified: Secondary | ICD-10-CM | POA: Diagnosis not present

## 2020-05-12 DIAGNOSIS — M545 Low back pain: Secondary | ICD-10-CM | POA: Diagnosis not present

## 2020-05-12 DIAGNOSIS — M5416 Radiculopathy, lumbar region: Secondary | ICD-10-CM | POA: Diagnosis not present

## 2020-05-13 DIAGNOSIS — R262 Difficulty in walking, not elsewhere classified: Secondary | ICD-10-CM | POA: Diagnosis not present

## 2020-05-13 DIAGNOSIS — M5416 Radiculopathy, lumbar region: Secondary | ICD-10-CM | POA: Diagnosis not present

## 2020-05-13 DIAGNOSIS — M79604 Pain in right leg: Secondary | ICD-10-CM | POA: Diagnosis not present

## 2020-05-13 DIAGNOSIS — M545 Low back pain: Secondary | ICD-10-CM | POA: Diagnosis not present

## 2020-05-19 DIAGNOSIS — R262 Difficulty in walking, not elsewhere classified: Secondary | ICD-10-CM | POA: Diagnosis not present

## 2020-05-19 DIAGNOSIS — M5416 Radiculopathy, lumbar region: Secondary | ICD-10-CM | POA: Diagnosis not present

## 2020-05-19 DIAGNOSIS — M79604 Pain in right leg: Secondary | ICD-10-CM | POA: Diagnosis not present

## 2020-05-19 DIAGNOSIS — M545 Low back pain: Secondary | ICD-10-CM | POA: Diagnosis not present

## 2020-05-21 DIAGNOSIS — R262 Difficulty in walking, not elsewhere classified: Secondary | ICD-10-CM | POA: Diagnosis not present

## 2020-05-21 DIAGNOSIS — M5416 Radiculopathy, lumbar region: Secondary | ICD-10-CM | POA: Diagnosis not present

## 2020-05-21 DIAGNOSIS — M545 Low back pain: Secondary | ICD-10-CM | POA: Diagnosis not present

## 2020-05-21 DIAGNOSIS — M79604 Pain in right leg: Secondary | ICD-10-CM | POA: Diagnosis not present

## 2020-05-22 DIAGNOSIS — E7800 Pure hypercholesterolemia, unspecified: Secondary | ICD-10-CM | POA: Diagnosis not present

## 2020-05-22 DIAGNOSIS — Z131 Encounter for screening for diabetes mellitus: Secondary | ICD-10-CM | POA: Diagnosis not present

## 2020-05-22 DIAGNOSIS — D500 Iron deficiency anemia secondary to blood loss (chronic): Secondary | ICD-10-CM | POA: Diagnosis not present

## 2020-05-26 DIAGNOSIS — M5416 Radiculopathy, lumbar region: Secondary | ICD-10-CM | POA: Diagnosis not present

## 2020-05-26 DIAGNOSIS — R262 Difficulty in walking, not elsewhere classified: Secondary | ICD-10-CM | POA: Diagnosis not present

## 2020-05-26 DIAGNOSIS — M79604 Pain in right leg: Secondary | ICD-10-CM | POA: Diagnosis not present

## 2020-05-26 DIAGNOSIS — M545 Low back pain: Secondary | ICD-10-CM | POA: Diagnosis not present

## 2020-05-28 DIAGNOSIS — M79604 Pain in right leg: Secondary | ICD-10-CM | POA: Diagnosis not present

## 2020-05-28 DIAGNOSIS — R262 Difficulty in walking, not elsewhere classified: Secondary | ICD-10-CM | POA: Diagnosis not present

## 2020-05-28 DIAGNOSIS — M5416 Radiculopathy, lumbar region: Secondary | ICD-10-CM | POA: Diagnosis not present

## 2020-05-28 DIAGNOSIS — M545 Low back pain: Secondary | ICD-10-CM | POA: Diagnosis not present

## 2020-05-29 DIAGNOSIS — M5136 Other intervertebral disc degeneration, lumbar region: Secondary | ICD-10-CM | POA: Diagnosis not present

## 2020-05-29 DIAGNOSIS — M5417 Radiculopathy, lumbosacral region: Secondary | ICD-10-CM | POA: Diagnosis not present

## 2020-05-29 DIAGNOSIS — M48062 Spinal stenosis, lumbar region with neurogenic claudication: Secondary | ICD-10-CM | POA: Diagnosis not present

## 2020-06-02 DIAGNOSIS — M545 Low back pain: Secondary | ICD-10-CM | POA: Diagnosis not present

## 2020-06-02 DIAGNOSIS — R262 Difficulty in walking, not elsewhere classified: Secondary | ICD-10-CM | POA: Diagnosis not present

## 2020-06-02 DIAGNOSIS — M79604 Pain in right leg: Secondary | ICD-10-CM | POA: Diagnosis not present

## 2020-06-02 DIAGNOSIS — M5416 Radiculopathy, lumbar region: Secondary | ICD-10-CM | POA: Diagnosis not present

## 2020-06-04 DIAGNOSIS — M5416 Radiculopathy, lumbar region: Secondary | ICD-10-CM | POA: Diagnosis not present

## 2020-06-04 DIAGNOSIS — R634 Abnormal weight loss: Secondary | ICD-10-CM | POA: Diagnosis not present

## 2020-06-04 DIAGNOSIS — R262 Difficulty in walking, not elsewhere classified: Secondary | ICD-10-CM | POA: Diagnosis not present

## 2020-06-04 DIAGNOSIS — D485 Neoplasm of uncertain behavior of skin: Secondary | ICD-10-CM | POA: Diagnosis not present

## 2020-06-04 DIAGNOSIS — M79604 Pain in right leg: Secondary | ICD-10-CM | POA: Diagnosis not present

## 2020-06-04 DIAGNOSIS — Z23 Encounter for immunization: Secondary | ICD-10-CM | POA: Diagnosis not present

## 2020-06-04 DIAGNOSIS — D500 Iron deficiency anemia secondary to blood loss (chronic): Secondary | ICD-10-CM | POA: Diagnosis not present

## 2020-06-04 DIAGNOSIS — R350 Frequency of micturition: Secondary | ICD-10-CM | POA: Diagnosis not present

## 2020-06-04 DIAGNOSIS — Z131 Encounter for screening for diabetes mellitus: Secondary | ICD-10-CM | POA: Diagnosis not present

## 2020-06-04 DIAGNOSIS — E7800 Pure hypercholesterolemia, unspecified: Secondary | ICD-10-CM | POA: Diagnosis not present

## 2020-06-04 DIAGNOSIS — R413 Other amnesia: Secondary | ICD-10-CM | POA: Diagnosis not present

## 2020-06-04 DIAGNOSIS — Z0000 Encounter for general adult medical examination without abnormal findings: Secondary | ICD-10-CM | POA: Diagnosis not present

## 2020-06-04 DIAGNOSIS — M545 Low back pain: Secondary | ICD-10-CM | POA: Diagnosis not present

## 2020-06-09 DIAGNOSIS — R262 Difficulty in walking, not elsewhere classified: Secondary | ICD-10-CM | POA: Diagnosis not present

## 2020-06-09 DIAGNOSIS — M545 Low back pain: Secondary | ICD-10-CM | POA: Diagnosis not present

## 2020-06-09 DIAGNOSIS — M5416 Radiculopathy, lumbar region: Secondary | ICD-10-CM | POA: Diagnosis not present

## 2020-06-09 DIAGNOSIS — M79604 Pain in right leg: Secondary | ICD-10-CM | POA: Diagnosis not present

## 2020-06-11 DIAGNOSIS — M545 Low back pain: Secondary | ICD-10-CM | POA: Diagnosis not present

## 2020-06-11 DIAGNOSIS — Z85828 Personal history of other malignant neoplasm of skin: Secondary | ICD-10-CM | POA: Diagnosis not present

## 2020-06-11 DIAGNOSIS — L720 Epidermal cyst: Secondary | ICD-10-CM | POA: Diagnosis not present

## 2020-06-11 DIAGNOSIS — D485 Neoplasm of uncertain behavior of skin: Secondary | ICD-10-CM | POA: Diagnosis not present

## 2020-06-11 DIAGNOSIS — M5416 Radiculopathy, lumbar region: Secondary | ICD-10-CM | POA: Diagnosis not present

## 2020-06-11 DIAGNOSIS — R262 Difficulty in walking, not elsewhere classified: Secondary | ICD-10-CM | POA: Diagnosis not present

## 2020-06-11 DIAGNOSIS — L821 Other seborrheic keratosis: Secondary | ICD-10-CM | POA: Diagnosis not present

## 2020-06-11 DIAGNOSIS — B078 Other viral warts: Secondary | ICD-10-CM | POA: Diagnosis not present

## 2020-06-11 DIAGNOSIS — M79604 Pain in right leg: Secondary | ICD-10-CM | POA: Diagnosis not present

## 2020-06-16 DIAGNOSIS — M545 Low back pain: Secondary | ICD-10-CM | POA: Diagnosis not present

## 2020-06-16 DIAGNOSIS — R262 Difficulty in walking, not elsewhere classified: Secondary | ICD-10-CM | POA: Diagnosis not present

## 2020-06-16 DIAGNOSIS — M5416 Radiculopathy, lumbar region: Secondary | ICD-10-CM | POA: Diagnosis not present

## 2020-06-16 DIAGNOSIS — M79604 Pain in right leg: Secondary | ICD-10-CM | POA: Diagnosis not present

## 2020-06-18 DIAGNOSIS — M79604 Pain in right leg: Secondary | ICD-10-CM | POA: Diagnosis not present

## 2020-06-18 DIAGNOSIS — M545 Low back pain: Secondary | ICD-10-CM | POA: Diagnosis not present

## 2020-06-18 DIAGNOSIS — R262 Difficulty in walking, not elsewhere classified: Secondary | ICD-10-CM | POA: Diagnosis not present

## 2020-06-18 DIAGNOSIS — M5416 Radiculopathy, lumbar region: Secondary | ICD-10-CM | POA: Diagnosis not present

## 2020-06-23 DIAGNOSIS — M5416 Radiculopathy, lumbar region: Secondary | ICD-10-CM | POA: Diagnosis not present

## 2020-06-23 DIAGNOSIS — R262 Difficulty in walking, not elsewhere classified: Secondary | ICD-10-CM | POA: Diagnosis not present

## 2020-06-23 DIAGNOSIS — M545 Low back pain: Secondary | ICD-10-CM | POA: Diagnosis not present

## 2020-06-23 DIAGNOSIS — M79604 Pain in right leg: Secondary | ICD-10-CM | POA: Diagnosis not present

## 2020-06-24 DIAGNOSIS — Z23 Encounter for immunization: Secondary | ICD-10-CM | POA: Diagnosis not present

## 2020-06-25 DIAGNOSIS — M79604 Pain in right leg: Secondary | ICD-10-CM | POA: Diagnosis not present

## 2020-06-25 DIAGNOSIS — M5416 Radiculopathy, lumbar region: Secondary | ICD-10-CM | POA: Diagnosis not present

## 2020-06-25 DIAGNOSIS — M545 Low back pain: Secondary | ICD-10-CM | POA: Diagnosis not present

## 2020-06-25 DIAGNOSIS — R262 Difficulty in walking, not elsewhere classified: Secondary | ICD-10-CM | POA: Diagnosis not present

## 2020-06-29 ENCOUNTER — Other Ambulatory Visit: Payer: Self-pay | Admitting: Neurological Surgery

## 2020-06-30 ENCOUNTER — Ambulatory Visit: Payer: Medicare Other

## 2020-07-01 DIAGNOSIS — M5416 Radiculopathy, lumbar region: Secondary | ICD-10-CM | POA: Diagnosis not present

## 2020-07-01 DIAGNOSIS — M79604 Pain in right leg: Secondary | ICD-10-CM | POA: Diagnosis not present

## 2020-07-01 DIAGNOSIS — R262 Difficulty in walking, not elsewhere classified: Secondary | ICD-10-CM | POA: Diagnosis not present

## 2020-07-01 DIAGNOSIS — M545 Low back pain, unspecified: Secondary | ICD-10-CM | POA: Diagnosis not present

## 2020-07-03 DIAGNOSIS — M545 Low back pain, unspecified: Secondary | ICD-10-CM | POA: Diagnosis not present

## 2020-07-03 DIAGNOSIS — M79604 Pain in right leg: Secondary | ICD-10-CM | POA: Diagnosis not present

## 2020-07-03 DIAGNOSIS — R262 Difficulty in walking, not elsewhere classified: Secondary | ICD-10-CM | POA: Diagnosis not present

## 2020-07-03 DIAGNOSIS — M5416 Radiculopathy, lumbar region: Secondary | ICD-10-CM | POA: Diagnosis not present

## 2020-07-06 ENCOUNTER — Encounter (HOSPITAL_COMMUNITY): Payer: Self-pay

## 2020-07-06 NOTE — Progress Notes (Signed)
Your procedure is scheduled on Friday, July 10, 2020.  Report to [facility] Main Entrance "A" at 5:30 A.M., and check in at the Admitting office.  Call this number if you have problems the morning of surgery:  [phone]  Call [phone] if you have any questions prior to your surgery date Monday-Friday 8am-4pm    Remember:  Do not eat or drink after midnight the night before your surgery    Do not take any medications the morning of surgery.  As of today, STOP taking any Aspirin (unless otherwise instructed by your surgeon) Aleve, Naproxen, Ibuprofen, Motrin, Advil, Goody's, BC's, all herbal medications, fish oil, and all vitamins.                      Do not wear jewelry, make up, or nail polish            Do not wear lotions, powders, perfumes, or deodorant.            Do not shave 48 hours prior to surgery.            Do not bring valuables to the hospital.            [facility] is not responsible for any belongings or valuables.  Do NOT Smoke (Tobacco/Vaping) or drink Alcohol 24 hours prior to your procedure If you use a CPAP at night, you may bring all equipment for your overnight stay.   Contacts, glasses, dentures or bridgework may not be worn into surgery.      For patients admitted to the hospital, discharge time will be determined by your treatment team.   Patients discharged the day of surgery will not be allowed to drive home, and someone needs to stay with them for 24 hours.    Special instructions:   [facility]- Preparing For Surgery  Before surgery, you can play an important role. Because skin is not sterile, your skin needs to be as free of germs as possible. You can reduce the number of germs on your skin by washing with CHG (chlorahexidine gluconate) Soap before surgery.  CHG is an antiseptic cleaner which kills germs and bonds with the skin to continue killing germs even after washing.    Oral Hygiene is also important to reduce your risk of  infection.  Remember - BRUSH YOUR TEETH THE MORNING OF SURGERY WITH YOUR REGULAR TOOTHPASTE  Please do not use if you have an allergy to CHG or antibacterial soaps. If your skin becomes reddened/irritated stop using the CHG.  Do not shave (including legs and underarms) for at least 48 hours prior to first CHG shower. It is OK to shave your face.  Please follow these instructions carefully.   1. Shower the NIGHT BEFORE SURGERY and the MORNING OF SURGERY with CHG Soap.   2. If you chose to wash your hair, wash your hair first as usual with your normal shampoo.  3. After you shampoo, rinse your hair and body thoroughly to remove the shampoo.  4. Use CHG as you would any other liquid soap. You can apply CHG directly to the skin and wash gently with a scrungie or a clean washcloth.   5. Apply the CHG Soap to your body ONLY FROM THE NECK DOWN.  Do not use on open wounds or open sores. Avoid contact with your eyes, ears, mouth and genitals (private parts). Wash Face and genitals (private parts)  with your normal soap.   6.  Wash thoroughly, paying special attention to the area where your surgery will be performed.  7. Thoroughly rinse your body with warm water from the neck down.  8. DO NOT shower/wash with your normal soap after using and rinsing off the CHG Soap.  9. Pat yourself dry with a CLEAN TOWEL.  10. Wear CLEAN PAJAMAS to bed the night before surgery  11. Place CLEAN SHEETS on your bed the night of your first shower and DO NOT SLEEP WITH PETS.   Day of Surgery: Wear Clean/Comfortable clothing the morning of surgery Do not apply any deodorants/lotions.   Remember to brush your teeth WITH YOUR REGULAR TOOTHPASTE.   Please read over the following fact sheets that you were given.

## 2020-07-07 ENCOUNTER — Encounter (HOSPITAL_COMMUNITY): Payer: Self-pay

## 2020-07-07 ENCOUNTER — Other Ambulatory Visit: Payer: Self-pay

## 2020-07-07 ENCOUNTER — Encounter (HOSPITAL_COMMUNITY)
Admission: RE | Admit: 2020-07-07 | Discharge: 2020-07-07 | Disposition: A | Discharge status: Home / Self Care | Payer: Medicare Other | Source: Ambulatory Visit | Attending: Neurological Surgery | Admitting: Neurological Surgery

## 2020-07-07 ENCOUNTER — Other Ambulatory Visit (HOSPITAL_COMMUNITY)
Admission: RE | Admit: 2020-07-07 | Discharge: 2020-07-07 | Disposition: A | Discharge status: Home / Self Care | Payer: Medicare Other | Source: Ambulatory Visit | Attending: Neurological Surgery | Admitting: Neurological Surgery

## 2020-07-07 DIAGNOSIS — Z20822 Contact with and (suspected) exposure to covid-19: Secondary | ICD-10-CM | POA: Insufficient documentation

## 2020-07-07 DIAGNOSIS — Z01818 Encounter for other preprocedural examination: Secondary | ICD-10-CM | POA: Insufficient documentation

## 2020-07-07 LAB — COMPREHENSIVE METABOLIC PANEL
ALT: 17 U/L (ref 0–44)
AST: 24 U/L (ref 15–41)
Albumin: 4.8 g/dL (ref 3.5–5.0)
Alkaline Phosphatase: 50 U/L (ref 38–126)
Anion gap: 11 (ref 5–15)
BUN: 12 mg/dL (ref 8–23)
CO2: 26 mmol/L (ref 22–32)
Calcium: 9.7 mg/dL (ref 8.9–10.3)
Chloride: 103 mmol/L (ref 98–111)
Creatinine, Ser: 0.62 mg/dL (ref 0.44–1.00)
GFR, Estimated: >60 mL/min (ref >60)
Glucose, Bld: 97 mg/dL (ref 70–99)
Potassium: 4.1 mmol/L (ref 3.5–5.1)
Sodium: 140 mmol/L (ref 135–145)
Total Bilirubin: 0.6 mg/dL (ref 0.3–1.2)
Total Protein: 7.3 g/dL (ref 6.5–8.1)

## 2020-07-07 LAB — SURGICAL PCR SCREEN
MRSA, PCR: NEGATIVE
Staphylococcus aureus: NEGATIVE

## 2020-07-07 LAB — CBC
HCT: 44 % (ref 36.0–46.0)
Hemoglobin: 14.1 g/dL (ref 12.0–15.0)
MCH: 31.6 pg (ref 26.0–34.0)
MCHC: 32 g/dL (ref 30.0–36.0)
MCV: 98.7 fL (ref 80.0–100.0)
Platelets: 347 10*3/uL (ref 150–400)
RBC: 4.46 MIL/uL (ref 3.87–5.11)
RDW: 12.1 % (ref 11.5–15.5)
WBC: 7.3 10*3/uL (ref 4.0–10.5)
nRBC: 0 % (ref 0.0–0.2)

## 2020-07-07 LAB — SARS CORONAVIRUS 2 (TAT 6-24 HRS): SARS Coronavirus 2: NEGATIVE

## 2020-07-07 NOTE — Progress Notes (Signed)
PCP - Dr. Lona Kettle Cardiologist - Dr. Fransico Him  PPM/ICD - Denies  Chest x-ray - N/A EKG - 07/07/20 Stress Test - 10/09/14 ECHO - 10/03/14 Cardiac Cath - Denies  Sleep Study - Denies  Patient denies having diabetes.   Blood Thinner Instructions: N/A Aspirin Instructions: N/A  ERAS Protcol - No  COVID TEST- 07/07/20   Anesthesia review: Yes, abnormal EKG  Patient denies shortness of breath, fever, cough and chest pain at PAT appointment   All instructions explained to the patient, with a verbal understanding of the material. Patient agrees to go over the instructions while at home for a better understanding. Patient also instructed to self quarantine after being tested for COVID-19. The opportunity to ask questions was provided.

## 2020-07-08 NOTE — Anesthesia Preprocedure Evaluation (Addendum)
Anesthesia Evaluation  Patient identified by MRN, date of birth, ID band Patient awake    Reviewed: Allergy & Precautions, NPO status , Patient's Chart, lab work & pertinent test results  History of Anesthesia Complications Negative for: history of anesthetic complications  Airway Mallampati: I  TM Distance: >3 FB Neck ROM: Full    Dental  (+) Dental Advisory Given, Teeth Intact,    Pulmonary neg pulmonary ROS,  Covid-19 Nucleic Acid Test Results Lab Results      Component                Value               Date                      SARSCOV2NAA              NEGATIVE            07/07/2020              breath sounds clear to auscultation       Cardiovascular hypertension, Pt. on medications and Pt. on home beta blockers (-) angina(-) Past MI and (-) CHF  Rhythm:Regular     Neuro/Psych Lumbar radiculopathy   Neuromuscular disease negative psych ROS   GI/Hepatic Neg liver ROS, GERD  Medicated and Controlled,  Endo/Other  negative endocrine ROS  Renal/GU negative Renal ROSLab Results      Component                Value               Date                      CREATININE               0.62                07/07/2020                 Musculoskeletal  (+) Arthritis ,   Abdominal   Peds  Hematology negative hematology ROS (+) Lab Results      Component                Value               Date                      WBC                      7.3                 07/07/2020                HGB                      14.1                07/07/2020                HCT                      44.0                07/07/2020                MCV  98.7                07/07/2020                PLT                      347                 07/07/2020              Anesthesia Other Findings   Reproductive/Obstetrics                            Anesthesia Physical Anesthesia Plan  ASA:  II  Anesthesia Plan: General   Post-op Pain Management:    Induction: Intravenous  PONV Risk Score and Plan: 3 and Ondansetron and Dexamethasone  Airway Management Planned: Oral ETT  Additional Equipment: None  Intra-op Plan:   Post-operative Plan: Extubation in OR  Informed Consent: I have reviewed the patients History and Physical, chart, labs and discussed the procedure including the risks, benefits and alternatives for the proposed anesthesia with the patient or authorized representative who has indicated his/her understanding and acceptance.     Dental advisory given  Plan Discussed with: CRNA and Surgeon  Anesthesia Plan Comments: (PAT note by Karoline Caldwell, PA-C: Pt seen by cardiology in 2016 for eval of atypical chest pain and palpitations. Stress myoview showed no ischemia and 2D echo showed normal LVF. It was felt her symptoms were likely GI related. She was advised to followup with cardiology as needed.   Preop labs reviewed, unremarkable.   EKG 07/07/20: Normal sinus rhythm. Rate 76. Possible Left atrial enlargement  TTE 10/03/14: - Left ventricle: The cavity size was normal. Systolic function was  normal. The estimated ejection fraction was in the range of 60%  to 65%. Wall motion was normal; there were no regional wall  motion abnormalities. Left ventricular diastolic function  parameters were normal.  - Atrial septum: No defect or patent foramen ovale was identified.    Nuclear stress 10/08/2014: Overall Impression:  Normal stress nuclear study.  There is no scar or ischemia. There is normal wall motion. This is a low risk scan. The patient has very good exercise tolerance for her age.  LV Ejection Fraction: 78%.  LV Wall Motion:  Normal Wall Motion  )       Anesthesia Quick Evaluation

## 2020-07-08 NOTE — Progress Notes (Signed)
Anesthesia Chart Review:  Pt seen by cardiology in 2016 for eval of atypical chest pain and palpitations. Stress myoview showed no ischemia and 2D echo showed normal LVF. It was felt her symptoms were likely GI related. She was advised to followup with cardiology as needed.   Preop labs reviewed, unremarkable.   EKG 07/07/20: Normal sinus rhythm. Rate 76. Possible Left atrial enlargement  TTE 10/03/14: - Left ventricle: The cavity size was normal. Systolic function was  normal. The estimated ejection fraction was in the range of 60%  to 65%. Wall motion was normal; there were no regional wall  motion abnormalities. Left ventricular diastolic function  parameters were normal.  - Atrial septum: No defect or patent foramen ovale was identified.    Nuclear stress 10/08/2014: Overall Impression:  Normal stress nuclear study.  There is no scar or ischemia. There is normal wall motion. This is a low risk scan. The patient has very good exercise tolerance for her age.  LV Ejection Fraction: 78%.  LV Wall Motion:  Normal Wall Motion     Lori [name] [facility] Short Stay Center/Anesthesiology Phone [phone] 07/08/2020 2:20 PM

## 2020-07-10 ENCOUNTER — Ambulatory Visit (HOSPITAL_COMMUNITY)
Admission: RE | Admit: 2020-07-10 | Discharge: 2020-07-10 | Disposition: A | Discharge status: Home / Self Care | Payer: Medicare Other | Attending: Neurological Surgery | Admitting: Neurological Surgery

## 2020-07-10 ENCOUNTER — Encounter (HOSPITAL_COMMUNITY): Payer: Self-pay | Admitting: Neurological Surgery

## 2020-07-10 ENCOUNTER — Ambulatory Visit (HOSPITAL_COMMUNITY): Payer: Medicare Other | Admitting: Certified Registered"

## 2020-07-10 ENCOUNTER — Encounter (HOSPITAL_COMMUNITY)
Admission: RE | Disposition: A | Discharge status: Home / Self Care | Payer: Self-pay | Source: Home / Self Care | Attending: Neurological Surgery

## 2020-07-10 ENCOUNTER — Other Ambulatory Visit: Payer: Self-pay

## 2020-07-10 ENCOUNTER — Ambulatory Visit (HOSPITAL_COMMUNITY): Payer: Medicare Other

## 2020-07-10 ENCOUNTER — Ambulatory Visit (HOSPITAL_COMMUNITY): Payer: Medicare Other | Admitting: Physician Assistant

## 2020-07-10 DIAGNOSIS — I10 Essential (primary) hypertension: Secondary | ICD-10-CM | POA: Diagnosis not present

## 2020-07-10 DIAGNOSIS — M4326 Fusion of spine, lumbar region: Secondary | ICD-10-CM | POA: Diagnosis not present

## 2020-07-10 DIAGNOSIS — Z96641 Presence of right artificial hip joint: Secondary | ICD-10-CM | POA: Diagnosis not present

## 2020-07-10 DIAGNOSIS — Z885 Allergy status to narcotic agent status: Secondary | ICD-10-CM | POA: Insufficient documentation

## 2020-07-10 DIAGNOSIS — M5416 Radiculopathy, lumbar region: Secondary | ICD-10-CM | POA: Insufficient documentation

## 2020-07-10 DIAGNOSIS — K219 Gastro-esophageal reflux disease without esophagitis: Secondary | ICD-10-CM | POA: Insufficient documentation

## 2020-07-10 DIAGNOSIS — Z888 Allergy status to other drugs, medicaments and biological substances status: Secondary | ICD-10-CM | POA: Insufficient documentation

## 2020-07-10 DIAGNOSIS — M48061 Spinal stenosis, lumbar region without neurogenic claudication: Secondary | ICD-10-CM | POA: Insufficient documentation

## 2020-07-10 DIAGNOSIS — R002 Palpitations: Secondary | ICD-10-CM | POA: Diagnosis not present

## 2020-07-10 DIAGNOSIS — Z79899 Other long term (current) drug therapy: Secondary | ICD-10-CM | POA: Diagnosis not present

## 2020-07-10 DIAGNOSIS — Z8249 Family history of ischemic heart disease and other diseases of the circulatory system: Secondary | ICD-10-CM | POA: Diagnosis not present

## 2020-07-10 DIAGNOSIS — M7138 Other bursal cyst, other site: Secondary | ICD-10-CM | POA: Insufficient documentation

## 2020-07-10 DIAGNOSIS — M1990 Unspecified osteoarthritis, unspecified site: Secondary | ICD-10-CM | POA: Diagnosis not present

## 2020-07-10 DIAGNOSIS — Z419 Encounter for procedure for purposes other than remedying health state, unspecified: Secondary | ICD-10-CM

## 2020-07-10 HISTORY — PX: LUMBAR LAMINECTOMY/DECOMPRESSION MICRODISCECTOMY: SHX5026

## 2020-07-10 SURGERY — LUMBAR LAMINECTOMY/DECOMPRESSION MICRODISCECTOMY 1 LEVEL
Anesthesia: General | Site: Back | Laterality: Right

## 2020-07-10 MED ORDER — CHLORHEXIDINE GLUCONATE CLOTH 2 % EX PADS: 6.0000 | MEDICATED_PAD | Freq: Once | CUTANEOUS | Status: DC

## 2020-07-10 MED ORDER — OXYCODONE-ACETAMINOPHEN 5-325 MG PO TABS: 1.0000 | ORAL_TABLET | Freq: Four times a day (QID) | ORAL | 0 refills | Status: AC | PRN

## 2020-07-10 MED ORDER — POTASSIUM CHLORIDE IN NACL 20-0.9 MEQ/L-% IV SOLN: INTRAVENOUS | Status: DC

## 2020-07-10 MED ORDER — ONDANSETRON HCL 4 MG PO TABS: 4.0000 mg | ORAL_TABLET | Freq: Four times a day (QID) | ORAL | Status: DC | PRN

## 2020-07-10 MED ORDER — SODIUM CHLORIDE 0.9% FLUSH: 3.0000 mL | Freq: Two times a day (BID) | INTRAVENOUS | Status: DC

## 2020-07-10 MED ORDER — ACETAMINOPHEN 10 MG/ML IV SOLN
1000.0000 mg | Freq: Once | INTRAVENOUS | Status: DC | PRN
  Administered 2020-07-10: 1000 mg via INTRAVENOUS

## 2020-07-10 MED ORDER — ONDANSETRON HCL 4 MG/2ML IJ SOLN: 4.0000 mg | Freq: Four times a day (QID) | INTRAMUSCULAR | Status: DC | PRN

## 2020-07-10 MED ORDER — ORAL CARE MOUTH RINSE: 15.0000 mL | Freq: Once | OROMUCOSAL | Status: AC

## 2020-07-10 MED ORDER — ACETAMINOPHEN 500 MG PO TABS: 1000.0000 mg | ORAL_TABLET | Freq: Four times a day (QID) | ORAL | Status: DC

## 2020-07-10 MED ORDER — OXYCODONE HCL 5 MG/5ML PO SOLN: 5.0000 mg | Freq: Once | ORAL | Status: AC | PRN

## 2020-07-10 MED ORDER — FENTANYL CITRATE (PF) 100 MCG/2ML IJ SOLN: 25.0000 ug | INTRAMUSCULAR | Status: DC | PRN

## 2020-07-10 MED ORDER — CELECOXIB 200 MG PO CAPS: 200.0000 mg | ORAL_CAPSULE | Freq: Two times a day (BID) | ORAL | Status: DC

## 2020-07-10 MED ORDER — FENTANYL CITRATE (PF) 250 MCG/5ML IJ SOLN
INTRAMUSCULAR | Status: DC | PRN
Start: 2020-07-10 — End: 2020-07-10
  Administered 2020-07-10 (×3): 50 ug via INTRAVENOUS

## 2020-07-10 MED ORDER — OXYCODONE HCL 5 MG PO TABS
ORAL_TABLET | ORAL | Status: AC
  Filled 2020-07-10: qty 1

## 2020-07-10 MED ORDER — MENTHOL 3 MG MT LOZG: 1.0000 | LOZENGE | OROMUCOSAL | Status: DC | PRN

## 2020-07-10 MED ORDER — 0.9 % SODIUM CHLORIDE (POUR BTL) OPTIME
TOPICAL | Status: DC | PRN
  Administered 2020-07-10: 1000 mL

## 2020-07-10 MED ORDER — ACETAMINOPHEN 500 MG PO TABS: 1000.0000 mg | ORAL_TABLET | Freq: Once | ORAL | Status: DC | PRN

## 2020-07-10 MED ORDER — OXYCODONE HCL 5 MG PO TABS
5.0000 mg | ORAL_TABLET | Freq: Once | ORAL | Status: AC | PRN
  Administered 2020-07-10: 5 mg via ORAL

## 2020-07-10 MED ORDER — MORPHINE SULFATE (PF) 2 MG/ML IV SOLN: 1.0000 mg | INTRAVENOUS | Status: DC | PRN

## 2020-07-10 MED ORDER — SUGAMMADEX SODIUM 200 MG/2ML IV SOLN
INTRAVENOUS | Status: DC | PRN
  Administered 2020-07-10: 100 mg via INTRAVENOUS

## 2020-07-10 MED ORDER — ACETAMINOPHEN 10 MG/ML IV SOLN
INTRAVENOUS | Status: AC
  Filled 2020-07-10: qty 100

## 2020-07-10 MED ORDER — LACTATED RINGERS IV SOLN: INTRAVENOUS | Status: DC

## 2020-07-10 MED ORDER — CHLORHEXIDINE GLUCONATE 0.12 % MT SOLN
15.0000 mL | Freq: Once | OROMUCOSAL | Status: AC
  Administered 2020-07-10: 15 mL via OROMUCOSAL
  Filled 2020-07-10: qty 15

## 2020-07-10 MED ORDER — BUPIVACAINE HCL (PF) 0.25 % IJ SOLN
INTRAMUSCULAR | Status: AC
  Filled 2020-07-10: qty 30

## 2020-07-10 MED ORDER — PROPOFOL 10 MG/ML IV BOLUS
INTRAVENOUS | Status: AC
  Filled 2020-07-10: qty 20

## 2020-07-10 MED ORDER — ACETAMINOPHEN 160 MG/5ML PO SOLN: 1000.0000 mg | Freq: Once | ORAL | Status: DC | PRN

## 2020-07-10 MED ORDER — LIDOCAINE 2% (20 MG/ML) 5 ML SYRINGE
INTRAMUSCULAR | Status: DC | PRN
  Administered 2020-07-10: 40 mg via INTRAVENOUS

## 2020-07-10 MED ORDER — ONDANSETRON HCL 4 MG/2ML IJ SOLN
INTRAMUSCULAR | Status: DC | PRN
  Administered 2020-07-10: 4 mg via INTRAVENOUS

## 2020-07-10 MED ORDER — BUPIVACAINE HCL (PF) 0.25 % IJ SOLN
INTRAMUSCULAR | Status: DC | PRN
  Administered 2020-07-10: 4 mL

## 2020-07-10 MED ORDER — SODIUM CHLORIDE 0.9 % IV SOLN: 250.0000 mL | INTRAVENOUS | Status: DC

## 2020-07-10 MED ORDER — ROCURONIUM BROMIDE 10 MG/ML (PF) SYRINGE
PREFILLED_SYRINGE | INTRAVENOUS | Status: DC | PRN
  Administered 2020-07-10: 40 mg via INTRAVENOUS

## 2020-07-10 MED ORDER — DEXAMETHASONE SODIUM PHOSPHATE 10 MG/ML IJ SOLN
INTRAMUSCULAR | Status: DC | PRN
  Administered 2020-07-10: 5 mg via INTRAVENOUS

## 2020-07-10 MED ORDER — THROMBIN 5000 UNITS EX SOLR
CUTANEOUS | Status: AC
  Filled 2020-07-10: qty 15000

## 2020-07-10 MED ORDER — THROMBIN 5000 UNITS EX SOLR
CUTANEOUS | Status: DC | PRN
  Administered 2020-07-10 (×2): 5000 [IU] via TOPICAL

## 2020-07-10 MED ORDER — FENTANYL CITRATE (PF) 250 MCG/5ML IJ SOLN
INTRAMUSCULAR | Status: AC
Start: 2020-07-10 — End: ?
  Filled 2020-07-10: qty 5

## 2020-07-10 MED ORDER — OXYCODONE-ACETAMINOPHEN 5-325 MG PO TABS
1.0000 | ORAL_TABLET | Freq: Four times a day (QID) | ORAL | Status: DC | PRN
Start: 2020-07-10 — End: 2020-07-10

## 2020-07-10 MED ORDER — CEFAZOLIN SODIUM-DEXTROSE 2-4 GM/100ML-% IV SOLN: 2.0000 g | Freq: Three times a day (TID) | INTRAVENOUS | Status: DC

## 2020-07-10 MED ORDER — SENNA 8.6 MG PO TABS: 1.0000 | ORAL_TABLET | Freq: Two times a day (BID) | ORAL | Status: DC

## 2020-07-10 MED ORDER — THROMBIN 5000 UNITS EX SOLR: OROMUCOSAL | Status: DC | PRN

## 2020-07-10 MED ORDER — CEFAZOLIN SODIUM-DEXTROSE 2-4 GM/100ML-% IV SOLN
2.0000 g | INTRAVENOUS | Status: AC
  Administered 2020-07-10: 2 g via INTRAVENOUS
  Filled 2020-07-10: qty 100

## 2020-07-10 MED ORDER — SODIUM CHLORIDE 0.9% FLUSH: 3.0000 mL | INTRAVENOUS | Status: DC | PRN

## 2020-07-10 MED ORDER — HEMOSTATIC AGENTS (NO CHARGE) OPTIME
TOPICAL | Status: DC | PRN
  Administered 2020-07-10: 1 via TOPICAL

## 2020-07-10 MED ORDER — PHENOL 1.4 % MT LIQD: 1.0000 | OROMUCOSAL | Status: DC | PRN

## 2020-07-10 MED ORDER — PHENYLEPHRINE HCL-NACL 10-0.9 MG/250ML-% IV SOLN
INTRAVENOUS | Status: DC | PRN
  Administered 2020-07-10: 25 ug/min via INTRAVENOUS

## 2020-07-10 MED ORDER — PROPOFOL 10 MG/ML IV BOLUS
INTRAVENOUS | Status: DC | PRN
  Administered 2020-07-10: 60 mg via INTRAVENOUS

## 2020-07-10 SURGICAL SUPPLY — 47 items
BAND RUBBER #18 3X1/16 STRL (MISCELLANEOUS) IMPLANT
BENZOIN TINCTURE PRP APPL 2/3 (GAUZE/BANDAGES/DRESSINGS) ×3 IMPLANT
BUR CARBIDE MATCH 3.0 (BURR) ×3 IMPLANT
CANISTER SUCT 3000ML PPV (MISCELLANEOUS) ×3 IMPLANT
CLOSURE WOUND 1/2 X4 (GAUZE/BANDAGES/DRESSINGS) ×1
COVER WAND RF STERILE (DRAPES) IMPLANT
DERMABOND ADVANCED (GAUZE/BANDAGES/DRESSINGS) ×2
DERMABOND ADVANCED .7 DNX12 (GAUZE/BANDAGES/DRESSINGS) ×1 IMPLANT
DIFFUSER DRILL AIR PNEUMATIC (MISCELLANEOUS) IMPLANT
DRAPE LAPAROTOMY 100X72X124 (DRAPES) ×3 IMPLANT
DRAPE MICROSCOPE LEICA (MISCELLANEOUS) IMPLANT
DRAPE SURG 17X23 STRL (DRAPES) ×3 IMPLANT
DRSG OPSITE POSTOP 3X4 (GAUZE/BANDAGES/DRESSINGS) ×3 IMPLANT
DRSG OPSITE POSTOP 4X6 (GAUZE/BANDAGES/DRESSINGS) ×3 IMPLANT
DURAPREP 26ML APPLICATOR (WOUND CARE) ×3 IMPLANT
ELECT REM PT RETURN 9FT ADLT (ELECTROSURGICAL) ×3
ELECTRODE REM PT RTRN 9FT ADLT (ELECTROSURGICAL) ×1 IMPLANT
GAUZE 4X4 16PLY RFD (DISPOSABLE) IMPLANT
GLOVE BIO SURGEON STRL SZ7 (GLOVE) ×3 IMPLANT
GLOVE BIO SURGEON STRL SZ7.5 (GLOVE) ×3 IMPLANT
GLOVE BIO SURGEON STRL SZ8 (GLOVE) ×3 IMPLANT
GLOVE BIOGEL PI IND STRL 7.0 (GLOVE) ×1 IMPLANT
GLOVE BIOGEL PI IND STRL 7.5 (GLOVE) ×2 IMPLANT
GLOVE BIOGEL PI INDICATOR 7.0 (GLOVE) ×2
GLOVE BIOGEL PI INDICATOR 7.5 (GLOVE) ×4
GOWN STRL REUS W/ TWL LRG LVL3 (GOWN DISPOSABLE) ×1 IMPLANT
GOWN STRL REUS W/ TWL XL LVL3 (GOWN DISPOSABLE) ×2 IMPLANT
GOWN STRL REUS W/TWL 2XL LVL3 (GOWN DISPOSABLE) IMPLANT
GOWN STRL REUS W/TWL LRG LVL3 (GOWN DISPOSABLE) ×3
GOWN STRL REUS W/TWL XL LVL3 (GOWN DISPOSABLE) ×6
HEMOSTAT POWDER KIT SURGIFOAM (HEMOSTASIS) ×3 IMPLANT
KIT BASIN OR (CUSTOM PROCEDURE TRAY) ×3 IMPLANT
KIT TURNOVER KIT B (KITS) ×3 IMPLANT
NEEDLE HYPO 25X1 1.5 SAFETY (NEEDLE) ×3 IMPLANT
NEEDLE SPNL 20GX3.5 QUINCKE YW (NEEDLE) IMPLANT
NS IRRIG 1000ML POUR BTL (IV SOLUTION) ×3 IMPLANT
PACK LAMINECTOMY NEURO (CUSTOM PROCEDURE TRAY) ×3 IMPLANT
PAD ARMBOARD 7.5X6 YLW CONV (MISCELLANEOUS) ×9 IMPLANT
SPONGE SURGIFOAM ABS GEL SZ50 (HEMOSTASIS) ×3 IMPLANT
STRIP CLOSURE SKIN 1/2X4 (GAUZE/BANDAGES/DRESSINGS) ×2 IMPLANT
SUT VIC AB 0 CT1 18XCR BRD8 (SUTURE) ×1 IMPLANT
SUT VIC AB 0 CT1 8-18 (SUTURE) ×3
SUT VIC AB 2-0 CP2 18 (SUTURE) ×3 IMPLANT
SUT VIC AB 3-0 SH 8-18 (SUTURE) ×6 IMPLANT
TOWEL GREEN STERILE (TOWEL DISPOSABLE) ×3 IMPLANT
TOWEL GREEN STERILE FF (TOWEL DISPOSABLE) ×3 IMPLANT
WATER STERILE IRR 1000ML POUR (IV SOLUTION) ×3 IMPLANT

## 2020-07-10 NOTE — Anesthesia Procedure Notes (Signed)
Procedure Name: Intubation Date/Time: 07/10/2020 7:43 AM Performed by: Amadeo Garnet, CRNA Pre-anesthesia Checklist: Patient identified, Emergency Drugs available, Suction available and Patient being monitored Patient Re-evaluated:Patient Re-evaluated prior to induction Oxygen Delivery Method: Circle system utilized Preoxygenation: Pre-oxygenation with 100% oxygen Induction Type: IV induction Ventilation: Mask ventilation without difficulty Laryngoscope Size: Mac and 3 Grade View: Grade I Tube type: Oral Tube size: 7.0 mm Number of attempts: 1 Airway Equipment and Method: Stylet Placement Confirmation: positive ETCO2,  ETT inserted through vocal cords under direct vision and breath sounds checked- equal and bilateral Secured at: 21 cm Tube secured with: Tape Dental Injury: Teeth and Oropharynx as per pre-operative assessment

## 2020-07-10 NOTE — Op Note (Signed)
07/10/2020  8:41 AM  PATIENT:  Lori [name]  86 y.o. 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 Imaging showed right lateral recess stenosis L4-5 with possible synovial cyst. The patient tried conservative measures without relief. Pain was debilitating. Recommended right L4-5 decompression. Patient understood the risks, benefits, and alternatives and potential outcomes and wished to proceed.  PROCEDURE DETAILS: The patient was taken to the operating room and after induction of adequate generalized endotracheal anesthesia, the patient was rolled into the prone position on the Wilson frame and all pressure points were padded. The lumbar region was cleaned and then prepped with DuraPrep and draped in the usual sterile fashion. 5 cc of local anesthesia was injected and then a dorsal midline incision was made and carried down to the lumbo sacral fascia. The fascia was opened and the paraspinous musculature was taken down in a subperiosteal fashion to expose L4 5 on the right. Intraoperative x-ray confirmed my level, and then I used a combination of the high-speed drill and the Kerrison punches to perform a hemilaminectomy, medial facetectomy, and foraminotomy at L4 5 on the right. The underlying yellow ligament was opened and removed in a piecemeal fashion to expose the underlying dura and exiting nerve root. I undercut the lateral recess and dissected down until I was  medial to and distal to the pedicle.  There was a synovial cyst somewhat adherent to the dura but causing significant compression of the right L5 nerve root.  This was removed carefully and in pieces.  Undercut the lateral recess and continued by decompression distally until I was just distal to the pedicle in order to remove the entire cyst.  The nerve root was well decompressed. We then gently retracted the nerve root medially with a retractor, coagulated the epidural venous vasculature, and inspected the disc space but found no significant disc herniation.  The L5 nerve root appeared to be free.. I then palpated with a coronary dilator along the nerve root and into the foramen to assure adequate decompression. I felt no more compression of the nerve root. I irrigated with saline solution containing bacitracin. Achieved hemostasis with bipolar cautery and Surgifoam and then irrigated this away,  and then closed the fascia with 0 Vicryl. I closed the subcutaneous tissues with 2-0 Vicryl and the subcuticular tissues with 3-0 Vicryl. The skin was then closed with benzoin and Steri-Strips. The drapes were removed, a sterile dressing was applied. the patient was awakened from general anesthesia and transferred to the recovery room in stable condition. At the end of the procedure all sponge, needle and instrument counts were correct.    PLAN OF CARE: Discharge to home after PACU  PATIENT DISPOSITION:  PACU - hemodynamically stable.   Delay start of Pharmacological VTE agent (>24hrs) due to surgical blood loss or risk of bleeding:  yes

## 2020-07-10 NOTE — Anesthesia Postprocedure Evaluation (Signed)
Anesthesia Post Note  Patient: Lori [name]  Procedure(s) Performed: Right Lumbar four-five Laminectomy/Foraminotomy (Right Back)     Patient location during evaluation: PACU Anesthesia Type: General Level of consciousness: awake and alert Pain management: pain level controlled Vital Signs Assessment: post-procedure vital signs reviewed and stable Respiratory status: spontaneous breathing, nonlabored ventilation, respiratory function stable and patient connected to nasal cannula oxygen Cardiovascular status: blood pressure returned to baseline and stable Postop Assessment: no apparent nausea or vomiting Anesthetic complications: no   No complications documented.  Last Vitals:  Vitals:   07/10/20 0928 07/10/20 0945  BP: (!) 140/59 (!) 147/57  Pulse: 72 71  Resp: 12 10  Temp:  36.4 C  SpO2: 96% 98%    Last Pain:  Vitals:   07/10/20 0945  TempSrc:   PainSc: 2     LLE Motor Response: Purposeful movement;Responds to commands (07/10/20 0945) LLE Sensation: Full sensation (07/10/20 0945) RLE Motor Response: Purposeful movement;Responds to commands (07/10/20 0945) RLE Sensation: Full sensation (07/10/20 0945)      Dwayne Bulkley

## 2020-07-10 NOTE — H&P (Signed)
Subjective: Patient is a 86 y.o. female admitted for R leg pain. Onset of symptoms was several months ago, gradually worsening since that time.  The pain is rated severe, and is located at the across the lower back and radiates to RLE. The pain is described as aching and occurs all day. The symptoms have been progressive. Symptoms are exacerbated by exercise. MRI or CT showed stenosis   Past Medical History:  Diagnosis Date  . Anemia   . Arthritis   . Frequent bowel movements   . GERD (gastroesophageal reflux disease)    10 lb wt loss past 3 yrs  . Hypertension    Can get hypotensive at times per patient  . Palpitations   . Rash    rt groin    Past Surgical History:  Procedure Laterality Date  . APPENDECTOMY    . ESOPHAGOGASTRODUODENOSCOPY N/A 07/26/2013   Procedure: ESOPHAGOGASTRODUODENOSCOPY (EGD);  Surgeon: Cleotis Nipper, MD;  Location: [facility];  Service: Endoscopy;  Laterality: N/A;  . GYNECOLOGIC CRYOSURGERY    . JOINT REPLACEMENT Right 2014  . TONSILLECTOMY    . TOTAL HIP ARTHROPLASTY Right 07/17/2013   Procedure: RIGHT TOTAL HIP ARTHROPLASTY ANTERIOR APPROACH;  Surgeon: Gearlean Alf, MD;  Location: [facility];  Service: Orthopedics;  Laterality: Right;    Prior to Admission medications   Medication Sig Start Date End Date Taking? Authorizing Provider  Apoaequorin (PREVAGEN PO) Take 1 tablet by mouth daily.   Yes [provider]  cholecalciferol (VITAMIN D) 1000 UNITS tablet Take 1,000 Units by mouth daily.   Yes [provider]  melatonin 5 MG TABS Take 2.5 mg by mouth at bedtime as needed (sleep).   Yes [provider]  Multiple Vitamin (MULTIVITAMIN WITH MINERALS) TABS tablet Take 1 tablet by mouth daily.   Yes [provider]  doxylamine, Sleep, (UNISOM) 25 MG tablet Take 25 mg by mouth at bedtime as needed for sleep. Patient not taking: Reported on 07/01/2020    [provider]  metoprolol tartrate (LOPRESSOR) 25 MG  tablet Take 12.5 mg by mouth 2 (two) times daily. Patient not taking: Reported on 07/01/2020    [provider]  omeprazole (PRILOSEC) 20 MG capsule Take 20 mg by mouth daily. 1 TAB EVERY OTHER DAY Patient not taking: Reported on 07/01/2020    [provider]   Allergies  Allergen Reactions  . Xarelto [Rivaroxaban] Diarrhea    Black stools  . Codeine Nausea And Vomiting  . Tramadol Other (See Comments)    syncope    Social History   Tobacco Use  . Smoking status: Never Smoker  . Smokeless tobacco: Never Used  Substance Use Topics  . Alcohol use: Yes    Comment: 1 glass wine daily - stopped for surgery    Family History  Problem Relation Age of Onset  . Heart disease Mother   . Heart attack Father   . Heart disease Father   . Heart disease Sister      Review of Systems  Positive ROS: neg  All other systems have been reviewed and were otherwise negative with the exception of those mentioned in the HPI and as above.  Objective: Vital signs in last 24 hours: Temp:  [98.3 F (36.8 C)] 98.3 F (36.8 C) (10/15 0550) Pulse Rate:  [85] 85 (10/15 0550) Resp:  [18] 18 (10/15 0550) BP: (135)/(71) 135/71 (10/15 0550) SpO2:  [100 %] 100 % (10/15 0550) Weight:  [46.4 kg] 46.4 kg (10/15 0645)  General Appearance: Alert, cooperative, no distress, appears stated age Head: Normocephalic, without obvious abnormality, atraumatic Eyes: PERRL, conjunctiva/corneas clear, EOM's intact    Neck: Supple, symmetrical, trachea midline Back: Symmetric, no curvature, ROM normal, no CVA tenderness Lungs:  respirations unlabored Heart: Regular rate and rhythm Abdomen: Soft, non-tender Extremities: Extremities normal, atraumatic, no cyanosis or edema Pulses: 2+ and symmetric all extremities Skin: Skin color, texture, turgor normal, no rashes or lesions  NEUROLOGIC:   Mental status: Alert and oriented x4,  no aphasia, good attention span, fund of knowledge, and memory Motor  Exam - grossly normal Sensory Exam - grossly normal Reflexes: 1+ Coordination - grossly normal Gait - grossly normal Balance - grossly normal Cranial Nerves: I: smell Not tested  II: visual acuity  OS: nl    OD: nl  II: visual fields Full to confrontation  II: pupils Equal, round, reactive to light  III,VII: ptosis None  III,IV,VI: extraocular muscles  Full ROM  V: mastication Normal  V: facial light touch sensation  Normal  V,VII: corneal reflex  Present  VII: facial muscle function - upper  Normal  VII: facial muscle function - lower Normal  VIII: hearing Not tested  IX: soft palate elevation  Normal  IX,X: gag reflex Present  XI: trapezius strength  5/5  XI: sternocleidomastoid strength 5/5  XI: neck flexion strength  5/5  XII: tongue strength  Normal    Data Review Lab Results  Component Value Date   WBC 7.3 07/07/2020   HGB 14.1 07/07/2020   HCT 44.0 07/07/2020   MCV 98.7 07/07/2020   PLT 347 07/07/2020   Lab Results  Component Value Date   NA 140 07/07/2020   K 4.1 07/07/2020   CL 103 07/07/2020   CO2 26 07/07/2020   BUN 12 07/07/2020   CREATININE 0.62 07/07/2020   GLUCOSE 97 07/07/2020   Lab Results  Component Value Date   INR 1.28 07/25/2013    Assessment/Plan:  Estimated body mass index is 18.1 kg/m as calculated from the following:   Height as of this encounter: 5\' 3"  (1.6 m).   Weight as of this encounter: 46.4 kg. Patient admitted for R L4-5 DLL. Patient has failed a reasonable attempt at conservative therapy.  I explained the condition and procedure to the patient and answered any questions.  Patient wishes to proceed with procedure as planned. Understands risks/ benefits and typical outcomes of procedure.   Eustace Moore 07/10/2020 7:17 AM

## 2020-07-10 NOTE — Transfer of Care (Signed)
Immediate Anesthesia Transfer of Care Note  Patient: Lori [name]  Procedure(s) Performed: Right Lumbar four-five Laminectomy/Foraminotomy (Right Back)  Patient Location: PACU  Anesthesia Type:General  Level of Consciousness: awake, alert  and oriented  Airway & Oxygen Therapy: Patient Spontanous Breathing  Post-op Assessment: Report given to RN, Post -op Vital signs reviewed and stable and Patient moving all extremities  Post vital signs: Reviewed and stable  Last Vitals:  Vitals Value Taken Time  BP    Temp    Pulse    Resp    SpO2      Last Pain:  Vitals:   07/10/20 0645  TempSrc:   PainSc: 0-No pain         Complications: No complications documented.

## 2020-07-11 ENCOUNTER — Encounter (HOSPITAL_COMMUNITY): Payer: Self-pay | Admitting: Neurological Surgery

## 2020-12-24 DIAGNOSIS — Z23 Encounter for immunization: Secondary | ICD-10-CM | POA: Diagnosis not present

## 2021-01-04 DIAGNOSIS — L718 Other rosacea: Secondary | ICD-10-CM | POA: Diagnosis not present

## 2021-01-04 DIAGNOSIS — L578 Other skin changes due to chronic exposure to nonionizing radiation: Secondary | ICD-10-CM | POA: Diagnosis not present

## 2021-01-04 DIAGNOSIS — Z85828 Personal history of other malignant neoplasm of skin: Secondary | ICD-10-CM | POA: Diagnosis not present

## 2021-02-01 IMAGING — CR DG LUMBAR SPINE 1V
1 series · 1 of 1 positions shown · non-contrast
Comparison: Lumbar spine x-rays dated June 23, 2020.

CLINICAL DATA: L4-L5 decompression.

EXAM:
LUMBAR SPINE - 1 VIEW

[lateral]
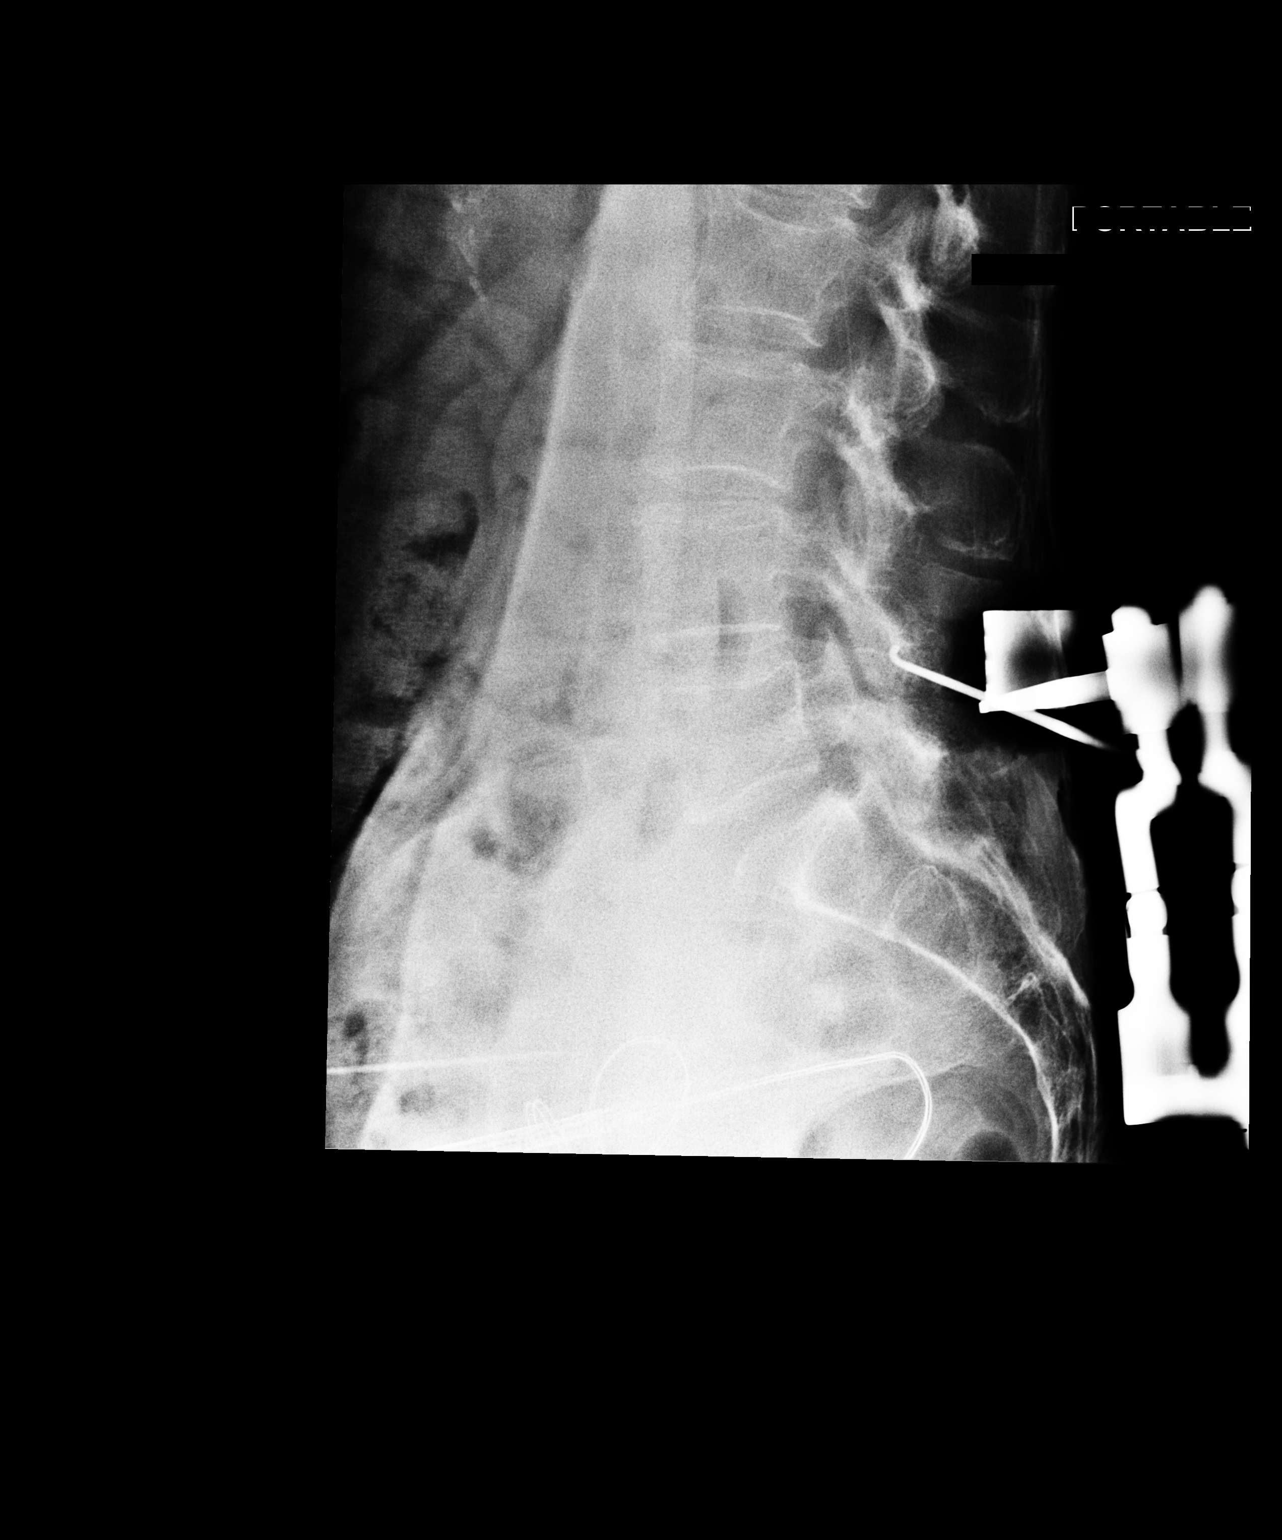

[1 of 1 positions shown; findings below may reference images not displayed]

FINDINGS: Single lateral intraoperative x-ray demonstrates surgical
instrumentation posterior to the L4-L5 level. No acute osseous
abnormality.
IMPRESSION: 1. Intraoperative localization at the L4-L5 level.

## 2021-02-06 ENCOUNTER — Other Ambulatory Visit: Payer: Self-pay

## 2021-02-06 ENCOUNTER — Emergency Department (HOSPITAL_COMMUNITY)
Admission: EM | Admit: 2021-02-06 | Discharge: 2021-02-06 | Disposition: A | Discharge status: Home / Self Care | Payer: Medicare Other | Attending: Emergency Medicine | Admitting: Emergency Medicine

## 2021-02-06 ENCOUNTER — Encounter (HOSPITAL_COMMUNITY): Payer: Self-pay | Admitting: *Deleted

## 2021-02-06 ENCOUNTER — Emergency Department (HOSPITAL_BASED_OUTPATIENT_CLINIC_OR_DEPARTMENT_OTHER): Payer: Medicare Other

## 2021-02-06 ENCOUNTER — Emergency Department (HOSPITAL_COMMUNITY): Payer: Medicare Other

## 2021-02-06 DIAGNOSIS — R0789 Other chest pain: Secondary | ICD-10-CM | POA: Diagnosis not present

## 2021-02-06 DIAGNOSIS — R42 Dizziness and giddiness: Secondary | ICD-10-CM | POA: Diagnosis not present

## 2021-02-06 DIAGNOSIS — R059 Cough, unspecified: Secondary | ICD-10-CM | POA: Insufficient documentation

## 2021-02-06 DIAGNOSIS — I10 Essential (primary) hypertension: Secondary | ICD-10-CM | POA: Insufficient documentation

## 2021-02-06 DIAGNOSIS — F419 Anxiety disorder, unspecified: Secondary | ICD-10-CM | POA: Insufficient documentation

## 2021-02-06 DIAGNOSIS — Z96641 Presence of right artificial hip joint: Secondary | ICD-10-CM | POA: Diagnosis not present

## 2021-02-06 DIAGNOSIS — R5383 Other fatigue: Secondary | ICD-10-CM | POA: Insufficient documentation

## 2021-02-06 DIAGNOSIS — R079 Chest pain, unspecified: Secondary | ICD-10-CM | POA: Diagnosis not present

## 2021-02-06 DIAGNOSIS — R072 Precordial pain: Secondary | ICD-10-CM | POA: Diagnosis not present

## 2021-02-06 DIAGNOSIS — R55 Syncope and collapse: Secondary | ICD-10-CM | POA: Insufficient documentation

## 2021-02-06 DIAGNOSIS — Z20822 Contact with and (suspected) exposure to covid-19: Secondary | ICD-10-CM | POA: Insufficient documentation

## 2021-02-06 DIAGNOSIS — M79605 Pain in left leg: Secondary | ICD-10-CM

## 2021-02-06 DIAGNOSIS — J439 Emphysema, unspecified: Secondary | ICD-10-CM | POA: Diagnosis not present

## 2021-02-06 DIAGNOSIS — Z79899 Other long term (current) drug therapy: Secondary | ICD-10-CM | POA: Diagnosis not present

## 2021-02-06 LAB — HEPATIC FUNCTION PANEL
ALT: 17 U/L (ref 0–44)
AST: 19 U/L (ref 15–41)
Albumin: 3.6 g/dL (ref 3.5–5.0)
Alkaline Phosphatase: 47 U/L (ref 38–126)
Bilirubin, Direct: <0.1 mg/dL (ref 0.0–0.2)
Total Bilirubin: 0.4 mg/dL (ref 0.3–1.2)
Total Protein: 5.9 g/dL — ABNORMAL LOW (ref 6.5–8.1)

## 2021-02-06 LAB — CBC
HCT: 40.7 % (ref 36.0–46.0)
Hemoglobin: 13.2 g/dL (ref 12.0–15.0)
MCH: 31.4 pg (ref 26.0–34.0)
MCHC: 32.4 g/dL (ref 30.0–36.0)
MCV: 96.9 fL (ref 80.0–100.0)
Platelets: 276 10*3/uL (ref 150–400)
RBC: 4.2 MIL/uL (ref 3.87–5.11)
RDW: 12.6 % (ref 11.5–15.5)
WBC: 6.2 10*3/uL (ref 4.0–10.5)
nRBC: 0 % (ref 0.0–0.2)

## 2021-02-06 LAB — TROPONIN I (HIGH SENSITIVITY)
Troponin I (High Sensitivity): 4 ng/L (ref <18)
Troponin I (High Sensitivity): 4 ng/L (ref <18)

## 2021-02-06 LAB — BASIC METABOLIC PANEL
Anion gap: 5 (ref 5–15)
BUN: 18 mg/dL (ref 8–23)
CO2: 24 mmol/L (ref 22–32)
Calcium: 8.9 mg/dL (ref 8.9–10.3)
Chloride: 108 mmol/L (ref 98–111)
Creatinine, Ser: 0.59 mg/dL (ref 0.44–1.00)
GFR, Estimated: >60 mL/min (ref >60)
Glucose, Bld: 110 mg/dL — ABNORMAL HIGH (ref 70–99)
Potassium: 4.1 mmol/L (ref 3.5–5.1)
Sodium: 137 mmol/L (ref 135–145)

## 2021-02-06 LAB — RESP PANEL BY RT-PCR (FLU A&B, COVID) ARPGX2
Influenza A by PCR: NEGATIVE
Influenza B by PCR: NEGATIVE
SARS Coronavirus 2 by RT PCR: NEGATIVE

## 2021-02-06 LAB — LIPASE, BLOOD: Lipase: 38 U/L (ref 11–51)

## 2021-02-06 LAB — D-DIMER, QUANTITATIVE: D-Dimer, Quant: 0.35 ug/mL-FEU (ref 0.00–0.50)

## 2021-02-06 NOTE — ED Triage Notes (Addendum)
Pt states acute onset chest pain while [place].  EMS gave 325 mg asa but did not give nitro b/c the pain had resolved.  Pain was not accompanied by sob or nausea.    nsr bp 131/60

## 2021-02-06 NOTE — ED Provider Notes (Signed)
[facility] EMERGENCY DEPARTMENT Provider Note   CSN: [id number] Arrival date & time: 02/06/21  1148     History Chief Complaint  Patient presents with  . Chest Pain    Lori [name] is a 87 y.o. female.  The history is provided by the patient, a relative and medical records. No language interpreter was used.  Chest Pain Pain location:  Substernal area Pain quality: pressure and sharp   Pain radiates to:  Does not radiate Pain severity:  Severe Onset quality:  Sudden Duration:  30 minutes Timing:  Constant Progression:  Resolved Chronicity:  New Relieved by:  Nothing Worsened by:  Nothing Ineffective treatments:  None tried Associated symptoms: anxiety, cough, fatigue and near-syncope   Associated symptoms: no abdominal pain, no back pain, no diaphoresis, no dizziness, no fever, no headache, no lower extremity edema, no nausea, no numbness, no palpitations, no shortness of breath, no vomiting and no weakness   Risk factors: not female        Past Medical History:  Diagnosis Date  . Anemia   . Arthritis   . Frequent bowel movements   . GERD (gastroesophageal reflux disease)    10 lb wt loss past 3 yrs  . Hypertension    Can get hypotensive at times per patient  . Palpitations   . Rash    rt groin    Patient Active Problem List   Diagnosis Date Noted  . Chest pain 10/02/2014  . Benign essential HTN 10/02/2014  . Melena 07/25/2013  . Weakness 07/25/2013  . Dizziness 07/25/2013  . Hyponatremia 07/18/2013  . Postoperative anemia due to acute blood loss 07/18/2013  . OA (osteoarthritis) of hip 07/17/2013    Past Surgical History:  Procedure Laterality Date  . APPENDECTOMY    . ESOPHAGOGASTRODUODENOSCOPY N/A 07/26/2013   Procedure: ESOPHAGOGASTRODUODENOSCOPY (EGD);  Surgeon: Florencia Reasons, MD;  Location: [facility];  Service: Endoscopy;  Laterality: N/A;  . GYNECOLOGIC CRYOSURGERY    . JOINT REPLACEMENT Right 2014  . LUMBAR  LAMINECTOMY/DECOMPRESSION MICRODISCECTOMY Right 07/10/2020   Procedure: Right Lumbar four-five Laminectomy/Foraminotomy;  Surgeon: Tia Alert, MD;  Location: [facility] OR;  Service: Neurosurgery;  Laterality: Right;  . TONSILLECTOMY    . TOTAL HIP ARTHROPLASTY Right 07/17/2013   Procedure: RIGHT TOTAL HIP ARTHROPLASTY ANTERIOR APPROACH;  Surgeon: Loanne Drilling, MD;  Location: [facility];  Service: Orthopedics;  Laterality: Right;     OB History   No obstetric history on file.     Family History  Problem Relation Age of Onset  . Heart disease Mother   . Heart attack Father   . Heart disease Father   . Heart disease Sister     Social History   Tobacco Use  . Smoking status: Never Smoker  . Smokeless tobacco: Never Used  Vaping Use  . Vaping Use: Never used  Substance Use Topics  . Alcohol use: Yes    Comment: 1 glass wine daily - stopped for surgery  . Drug use: No    Home Medications Prior to Admission medications   Medication Sig Start Date End Date Taking? Authorizing Provider  Apoaequorin (PREVAGEN PO) Take 1 tablet by mouth daily.    [provider]  cholecalciferol (VITAMIN D) 1000 UNITS tablet Take 1,000 Units by mouth daily.    [provider]  doxylamine, Sleep, (UNISOM) 25 MG tablet Take 25 mg by mouth at bedtime as needed for sleep. Patient not taking: Reported on 07/01/2020  [provider]  melatonin 5 MG TABS Take 2.5 mg by mouth at bedtime as needed (sleep).    [provider]  metoprolol tartrate (LOPRESSOR) 25 MG tablet Take 12.5 mg by mouth 2 (two) times daily. Patient not taking: Reported on 07/01/2020    [provider]  Multiple Vitamin (MULTIVITAMIN WITH MINERALS) TABS tablet Take 1 tablet by mouth daily.    [provider]  omeprazole (PRILOSEC) 20 MG capsule Take 20 mg by mouth daily. 1 TAB EVERY OTHER DAY Patient not taking: Reported on 07/01/2020    [provider]  oxyCODONE-acetaminophen  (PERCOCET) 5-325 MG tablet Take 1 tablet by mouth every 6 (six) hours as needed for severe pain. 07/10/20 07/10/21  Eustace Moore, MD    Allergies    Xarelto [rivaroxaban], Codeine, and Tramadol  Review of Systems   Review of Systems  Constitutional: Positive for fatigue. Negative for chills, diaphoresis and fever.  HENT: Negative for congestion.   Eyes: Negative for visual disturbance.  Respiratory: Positive for cough. Negative for chest tightness, shortness of breath and wheezing.   Cardiovascular: Positive for chest pain and near-syncope. Negative for palpitations.  Gastrointestinal: Negative for abdominal pain, constipation, diarrhea, nausea and vomiting.  Genitourinary: Negative for dysuria, flank pain and frequency.  Musculoskeletal: Negative for back pain, neck pain and neck stiffness.  Skin: Negative for rash and wound.  Neurological: Negative for dizziness, speech difficulty, weakness, light-headedness, numbness and headaches.  Psychiatric/Behavioral: Negative for agitation and confusion.  All other systems reviewed and are negative.   Physical Exam Updated Vital Signs BP (!) 135/50   Pulse 78   Temp 98.1 F (36.7 C) (Oral)   Resp 17   Ht 5' 2.5" (1.588 m)   Wt 45.3 kg   SpO2 96%   BMI 17.96 kg/m   Physical Exam Vitals and nursing note reviewed.  Constitutional:      General: She is not in acute distress.    Appearance: She is well-developed. She is not ill-appearing, toxic-appearing or diaphoretic.  HENT:     Head: Normocephalic and atraumatic.  Eyes:     Conjunctiva/sclera: Conjunctivae normal.     Pupils: Pupils are equal, round, and reactive to light.  Cardiovascular:     Rate and Rhythm: Normal rate and regular rhythm.     Heart sounds: Normal heart sounds. No murmur heard.   Pulmonary:     Effort: Pulmonary effort is normal. No respiratory distress.     Breath sounds: Normal breath sounds. No decreased breath sounds, wheezing, rhonchi or rales.   Chest:     Chest wall: No tenderness.  Abdominal:     Palpations: Abdomen is soft.     Tenderness: There is no abdominal tenderness.  Musculoskeletal:     Cervical back: Neck supple.     Right lower leg: No tenderness.     Left lower leg: No tenderness.  Skin:    General: Skin is warm and dry.     Capillary Refill: Capillary refill takes less than 2 seconds.     Findings: No erythema.  Neurological:     General: No focal deficit present.     Mental Status: She is alert and oriented to person, place, and time.     Cranial Nerves: No cranial nerve deficit.  Psychiatric:        Mood and Affect: Mood is anxious.     ED Results / Procedures / Treatments   Labs (all labs ordered are listed, but only  abnormal results are displayed) Labs Reviewed  BASIC METABOLIC PANEL - Abnormal; Notable for the following components:      Result Value   Glucose, Bld 110 (*)    All other components within normal limits  HEPATIC FUNCTION PANEL - Abnormal; Notable for the following components:   Total Protein 5.9 (*)    All other components within normal limits  RESP PANEL BY RT-PCR (FLU A&B, COVID) ARPGX2  CBC  D-DIMER, QUANTITATIVE  LIPASE, BLOOD  TROPONIN I (HIGH SENSITIVITY)  TROPONIN I (HIGH SENSITIVITY)    EKG EKG Interpretation  Date/Time:  Saturday Feb 06 2021 11:55:32 EDT Ventricular Rate:  80 PR Interval:  148 QRS Duration: 96 QT Interval:  366 QTC Calculation: 423 R Axis:   -46 Text Interpretation: Sinus rhythm LAD, consider left anterior fascicular block RSR' in V1 or V2, right VCD or RVH When compared to prior, similar appearance. No STEMI Confirmed by Antony Blackbird [phone]) on 02/06/2021 12:11:09 PM   Radiology DG Chest 2 View  Result Date: 02/06/2021 CLINICAL DATA:  Chest pain EXAM: CHEST - 2 VIEW COMPARISON:  07/10/2013 FINDINGS: The heart size and mediastinal contours are within normal limits. Probable emphysema. The visualized skeletal structures are unremarkable.  IMPRESSION: No acute abnormality of the lungs.  Probable emphysema. Electronically Signed   By: Eddie Candle M.D.   On: 02/06/2021 12:44   VAS [place] LOWER EXTREMITY VENOUS (DVT) (ONLY [facility])  Result Date: 02/06/2021  Lower Venous DVT Study Patient Name:  JAMAIRA SHERK  Date of Exam:   02/06/2021 Medical Rec #: [id number]         Accession #:    [id number] Date of Birth: 03-11-1932         Patient Gender: F Patient Age:   89Y Exam Location:  [facility] Procedure:      VAS [place] LOWER EXTREMITY VENOUS (DVT) Referring Phys: [id number] [place] --------------------------------------------------------------------------------  Indications: Pain.  Comparison Study: No previous exams Performing Technologist: Rogelia Rohrer  Examination Guidelines: A complete evaluation includes B-mode imaging, spectral Doppler, color Doppler, and power Doppler as needed of all accessible portions of each vessel. Bilateral testing is considered an integral part of a complete examination. Limited examinations for reoccurring indications may be performed as noted. The reflux portion of the exam is performed with the patient in reverse Trendelenburg.  +-----+---------------+---------+-----------+----------+--------------+ RIGHTCompressibilityPhasicitySpontaneityPropertiesThrombus Aging +-----+---------------+---------+-----------+----------+--------------+ CFV  Full           Yes      Yes                                 +-----+---------------+---------+-----------+----------+--------------+   +---------+---------------+---------+-----------+----------+-------------------+ LEFT     CompressibilityPhasicitySpontaneityPropertiesThrombus Aging      +---------+---------------+---------+-----------+----------+-------------------+ CFV      Full           Yes      Yes                                      +---------+---------------+---------+-----------+----------+-------------------+ SFJ      Full                                                              +---------+---------------+---------+-----------+----------+-------------------+  FV Prox  Full           Yes      Yes                                      +---------+---------------+---------+-----------+----------+-------------------+ FV Mid   Full           Yes      Yes                                      +---------+---------------+---------+-----------+----------+-------------------+ FV DistalFull           Yes      Yes                                      +---------+---------------+---------+-----------+----------+-------------------+ PFV      Full                                                             +---------+---------------+---------+-----------+----------+-------------------+ POP      Full           Yes      Yes                                      +---------+---------------+---------+-----------+----------+-------------------+ PTV      Full                                                             +---------+---------------+---------+-----------+----------+-------------------+ PERO     Full                                         Not well visualized +---------+---------------+---------+-----------+----------+-------------------+     Summary: RIGHT: - No evidence of common femoral vein obstruction.  LEFT: - There is no evidence of deep vein thrombosis in the lower extremity. - There is no evidence of superficial venous thrombosis.  - No cystic structure found in the popliteal fossa.  *See table(s) above for measurements and observations. Electronically signed by Servando Snare MD on 02/06/2021 at 29:04:18 PM.    Final     Procedures Procedures   Medications Ordered in ED Medications - No data to display  ED Course  I have reviewed the triage vital signs and the nursing notes.  Pertinent labs & imaging results that were available during my care of the patient were reviewed by me and  considered in my medical decision making (see chart for details).    MDM Rules/Calculators/A&P                          NYLEAH MCGINNIS is a 87 y.o. female with a past  medical history significant for hypertension, GERD, anemia, and osteoarthritis who presents with several days of left calf pain that have resolved and then an episode of severe chest pain with near syncope today.  Patient reports that she is been having some stress and anxiety related to moving from the home she lived for the last 60 years as she gets.  [place] beach [place].  She is currently packing and getting ready to move.  She says that she has never had chest pain like this before and this morning, had sudden onset of severe chest pain in her central sternal chest that radiated around towards the sides of her chest.  She reports it was sharp but does not remember if it was exertional or pleuritic.  Patient thought she was going to pass out and had this impending sense of doom like she might die.  She reports no shortness of breath but did report feeling cold and clammy.  This lasted roughly 30 minutes but resolved on the way to the emergency department with EMS.  Patient received aspirin but did not get nitroglycerin as the pain was already improving.  She reports some left calf pain for the last few days that has improved.  She denies significant swelling.  Denies history of DVT or PE.  She does report that over the last week and she went [place] the mountains and then has been going back and forth from the beach recently as well getting ready for the move.  She reports that he saw she may have a component of a panic attack with it as well. She reports her parents have had early heart disease as well.  She does report that she was feeling some mild fatigue and malaise yesterday but had no other focal symptoms at that time.  No reported urinary symptoms with a mild dry cough.  On exam, lungs are clear and chest is nontender.  No murmur.   Abdomen nontender.  Good pulses in extremities.  Patient is resting comfortably now but was very anxious about what happened.  She reports that she thought she was going to die.  No calf tenderness or leg tenderness.  Good pulses.  EKG shows no STEMI.  Clinically I do feel we need to rule out DVT with ultrasound and will get a D-dimer to rule out PE.  We we will get screening labs, chest x-ray, and monitor on telemetry with his chest discomfort.  Most likely, patient has having some musculoskeletal discomfort in the setting of moving and then when he started feeling discomfort had a panic attack and exacerbation of anxiety and stress she reports that a feel member died in this home and she did not want to die in front of her daughter.  Anticipate reassessment for work-up to determine disposition.   Work-up overall reassuring.  D-dimer negative, doubt PE.  COVID and flu test negative.  Troponin negative x2.  Patient had no further chest pain during her ED stay.  Other labs also reassuring.  Chest x-ray shows no pneumonia.  Clinically I suspect a component of panic attack in relation to atypical chest discomfort.  We did discuss how her heart score is a 4 and discussed the possibility of discharge versus admission.  Given her well appearance we feel she is appropriate for discharge with outpatient PCP and cardiology follow-up.  She agrees.  Patient had no other questions or concerns and was discharged in good condition with understanding return precautions and plan of care.  Final Clinical Impression(s) / ED Diagnoses Final diagnoses:  Atypical chest pain  Lightheaded    Rx / DC Orders ED Discharge Orders    None     Clinical Impression: 1. Atypical chest pain   2. Lightheaded     Disposition: Discharge  Condition: Good  I have discussed the results, Dx and Tx plan with the pt(& family if present). He/she/they expressed understanding and agree(s) with the plan. Discharge instructions  discussed at great length. Strict return precautions discussed and pt &/or family have verbalized understanding of the instructions. No further questions at time of discharge.    New Prescriptions   No medications on file    Follow Up: Lawerance Cruel, MD [place] [address] [phone]     [facility] [place] [id number] [place] EMERGENCY DEPARTMENT [address] [id number] mc [place] [phone]       Feliz Herard, Gwenyth Allegra, MD 02/06/21 1859

## 2021-02-06 NOTE — Progress Notes (Signed)
LLE venous duplex has been completed.  Dr. Sherry Ruffing given preliminary results.  Results can be found under chart review under CV PROC. 02/06/2021 3:21 PM Lori [name] RVT, RDMS

## 2021-02-06 NOTE — Discharge Instructions (Signed)
Your work-up today was overall reassuring.  Your cardiac enzymes were negative both times we checked them and your blood clot test was negative.  Your chest x-ray is reassuring.  Other labs similar to prior.  Based on your well appearance, we feel you are safe for discharge home.  I suspect there was a component of panic attack as we discussed related to the chest discomfort.  Please rest and stay hydrated.  Please follow-up with your PCP as well as a cardiologist for reassessment.  If any symptoms change or worsen acutely, please return to the nearest emergency department.

## 2021-03-22 DIAGNOSIS — R413 Other amnesia: Secondary | ICD-10-CM | POA: Diagnosis not present

## 2021-03-22 DIAGNOSIS — F411 Generalized anxiety disorder: Secondary | ICD-10-CM | POA: Diagnosis not present

## 2021-03-22 DIAGNOSIS — Z681 Body mass index (BMI) 19 or less, adult: Secondary | ICD-10-CM | POA: Diagnosis not present

## 2021-03-22 DIAGNOSIS — H9193 Unspecified hearing loss, bilateral: Secondary | ICD-10-CM | POA: Diagnosis not present

## 2021-03-22 DIAGNOSIS — Z1283 Encounter for screening for malignant neoplasm of skin: Secondary | ICD-10-CM | POA: Diagnosis not present

## 2021-03-31 DIAGNOSIS — Z681 Body mass index (BMI) 19 or less, adult: Secondary | ICD-10-CM | POA: Diagnosis not present

## 2021-03-31 DIAGNOSIS — R413 Other amnesia: Secondary | ICD-10-CM | POA: Diagnosis not present

## 2021-03-31 DIAGNOSIS — R4189 Other symptoms and signs involving cognitive functions and awareness: Secondary | ICD-10-CM | POA: Diagnosis not present

## 2021-04-16 DIAGNOSIS — H903 Sensorineural hearing loss, bilateral: Secondary | ICD-10-CM | POA: Diagnosis not present

## 2021-04-16 DIAGNOSIS — H9190 Unspecified hearing loss, unspecified ear: Secondary | ICD-10-CM | POA: Diagnosis not present

## 2021-04-29 DIAGNOSIS — H2589 Other age-related cataract: Secondary | ICD-10-CM | POA: Diagnosis not present

## 2021-04-29 DIAGNOSIS — H524 Presbyopia: Secondary | ICD-10-CM | POA: Diagnosis not present

## 2021-04-29 DIAGNOSIS — H2512 Age-related nuclear cataract, left eye: Secondary | ICD-10-CM | POA: Diagnosis not present

## 2021-04-29 DIAGNOSIS — Z961 Presence of intraocular lens: Secondary | ICD-10-CM | POA: Diagnosis not present

## 2021-05-18 DIAGNOSIS — H2589 Other age-related cataract: Secondary | ICD-10-CM | POA: Diagnosis not present

## 2021-05-18 DIAGNOSIS — H2512 Age-related nuclear cataract, left eye: Secondary | ICD-10-CM | POA: Diagnosis not present

## 2021-05-18 DIAGNOSIS — H26491 Other secondary cataract, right eye: Secondary | ICD-10-CM | POA: Diagnosis not present

## 2021-05-18 DIAGNOSIS — H5709 Other anomalies of pupillary function: Secondary | ICD-10-CM | POA: Diagnosis not present

## 2021-08-31 IMAGING — DX DG CHEST 2V
2 series · 2 of 2 positions shown · non-contrast
Comparison: 07/10/2013

CLINICAL DATA: Chest pain

EXAM:
CHEST - 2 VIEW

[chest pa]
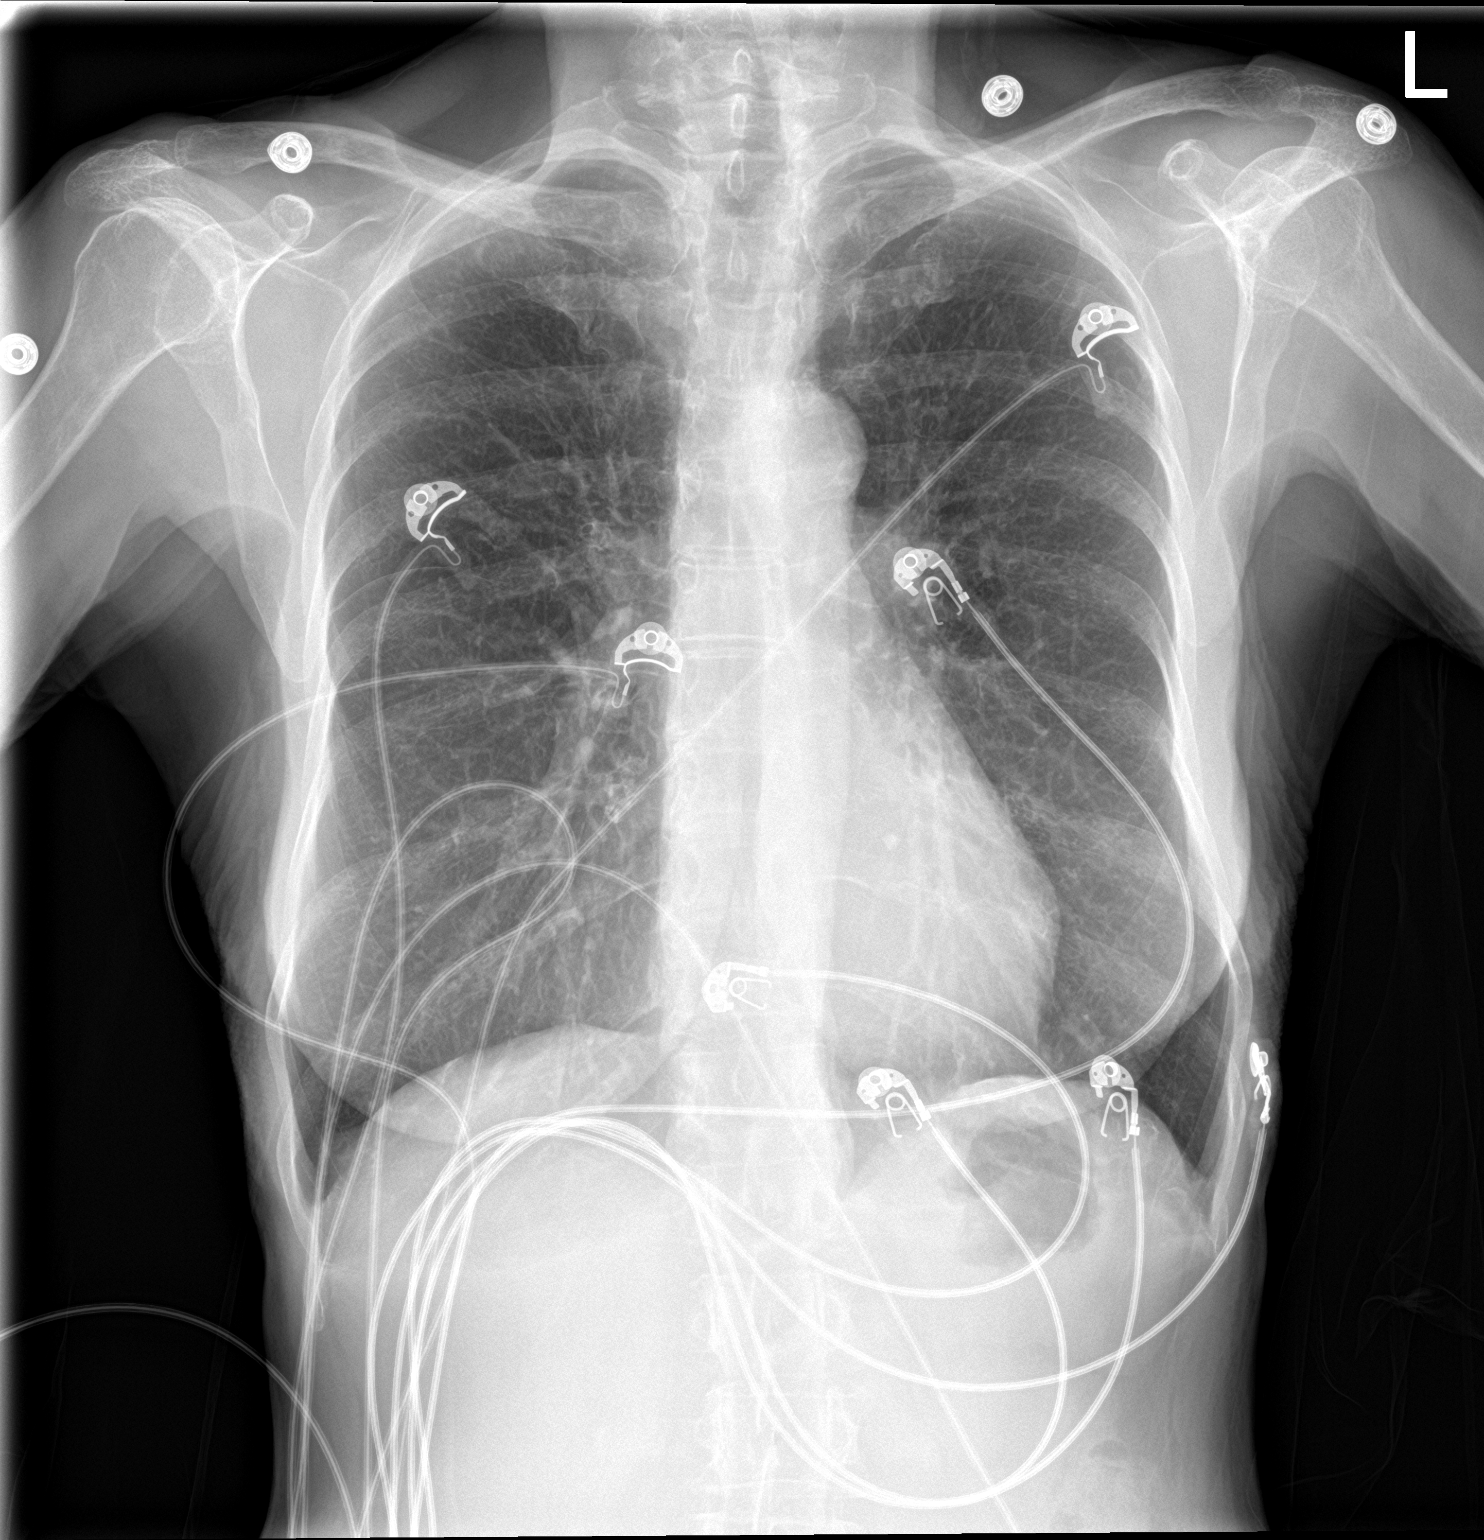

[chest lat]
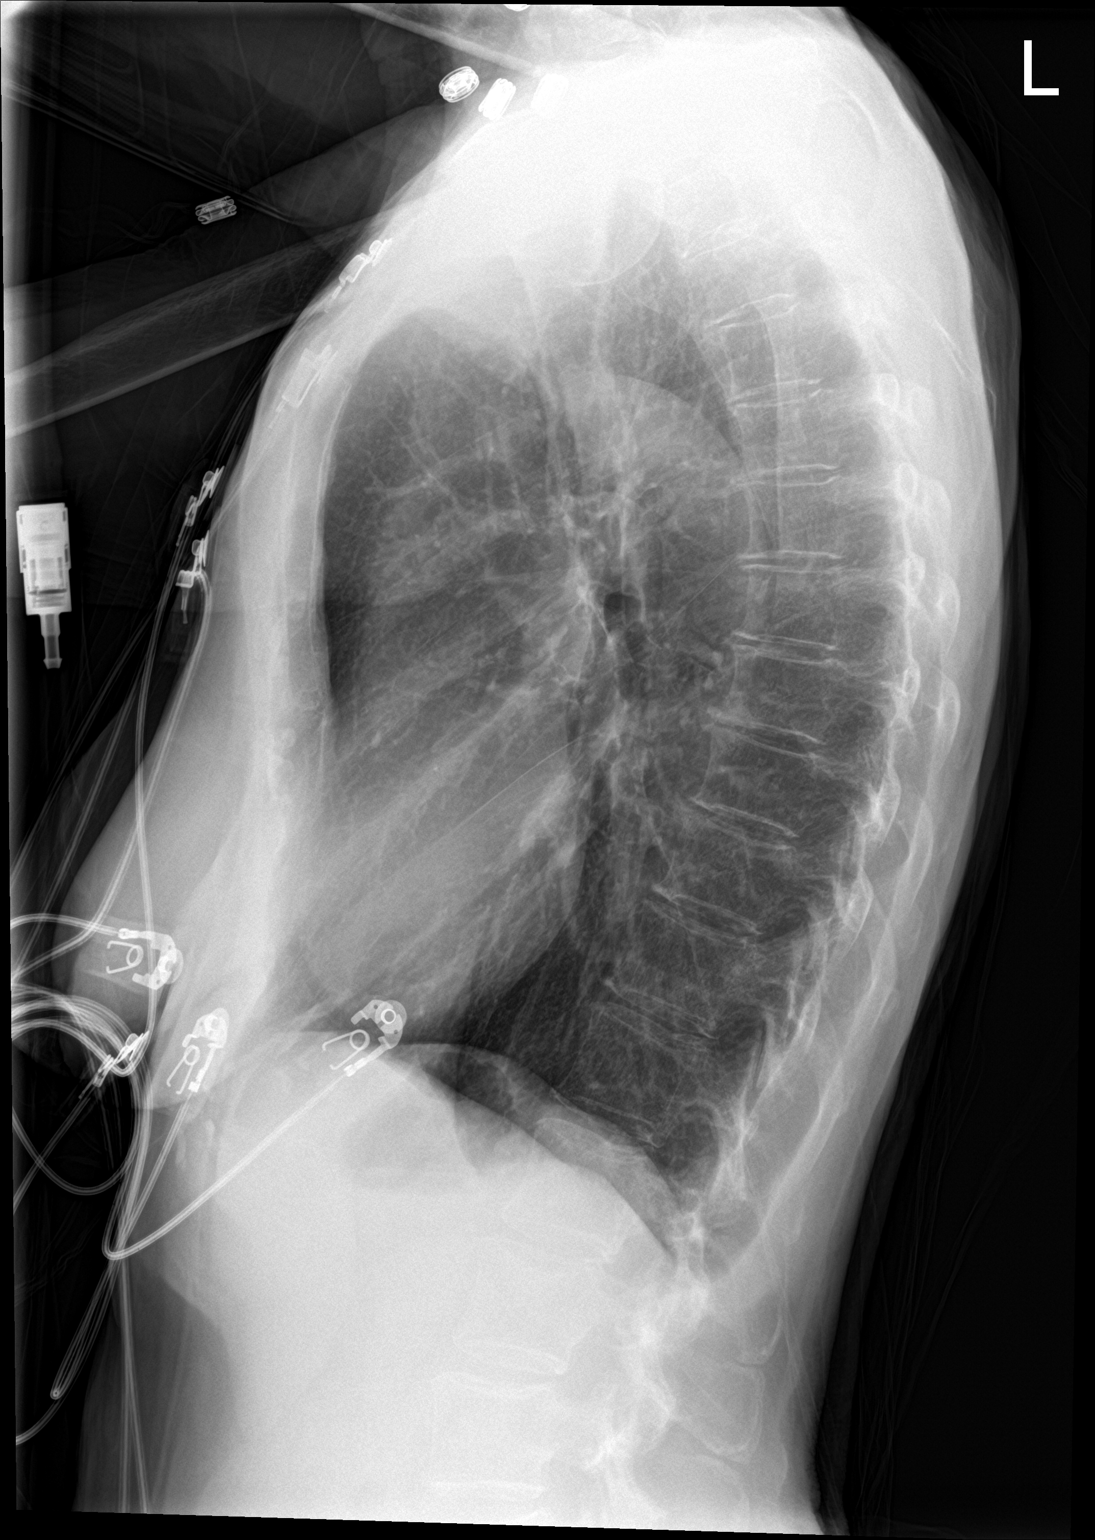

[2 of 2 positions shown; findings below may reference images not displayed]

FINDINGS: The heart size and mediastinal contours are within normal limits.
Probable emphysema. The visualized skeletal structures are
unremarkable.
IMPRESSION: No acute abnormality of the lungs.  Probable emphysema.
